# Patient Record
Sex: Female | Born: 1980 | Race: White | Hispanic: No | Smoking: Current every day smoker
Health system: Southern US, Community
[De-identification: ages and names within clinical notes are randomized; demographics above are authoritative.]

## PROBLEM LIST (undated history)

## (undated) DIAGNOSIS — N926 Irregular menstruation, unspecified: Secondary | ICD-10-CM

## (undated) DIAGNOSIS — R6882 Decreased libido: Secondary | ICD-10-CM

## (undated) DIAGNOSIS — N760 Acute vaginitis: Secondary | ICD-10-CM

## (undated) DIAGNOSIS — F419 Anxiety disorder, unspecified: Secondary | ICD-10-CM

## (undated) DIAGNOSIS — F4310 Post-traumatic stress disorder, unspecified: Secondary | ICD-10-CM

## (undated) DIAGNOSIS — B379 Candidiasis, unspecified: Secondary | ICD-10-CM

## (undated) DIAGNOSIS — M79605 Pain in left leg: Secondary | ICD-10-CM

## (undated) DIAGNOSIS — Z8619 Personal history of other infectious and parasitic diseases: Secondary | ICD-10-CM

## (undated) DIAGNOSIS — M797 Fibromyalgia: Secondary | ICD-10-CM

## (undated) DIAGNOSIS — M79662 Pain in left lower leg: Secondary | ICD-10-CM

## (undated) DIAGNOSIS — Z87898 Personal history of other specified conditions: Secondary | ICD-10-CM

## (undated) DIAGNOSIS — F17200 Nicotine dependence, unspecified, uncomplicated: Secondary | ICD-10-CM

## (undated) DIAGNOSIS — G4700 Insomnia, unspecified: Secondary | ICD-10-CM

## (undated) DIAGNOSIS — F411 Generalized anxiety disorder: Secondary | ICD-10-CM

## (undated) DIAGNOSIS — R251 Tremor, unspecified: Secondary | ICD-10-CM

## (undated) DIAGNOSIS — F329 Major depressive disorder, single episode, unspecified: Secondary | ICD-10-CM

## (undated) DIAGNOSIS — B20 Human immunodeficiency virus [HIV] disease: Principal | ICD-10-CM

## (undated) DIAGNOSIS — G709 Myoneural disorder, unspecified: Secondary | ICD-10-CM

## (undated) DIAGNOSIS — F32A Depression, unspecified: Secondary | ICD-10-CM

## (undated) HISTORY — DX: Major depressive disorder, single episode, unspecified: F32.9

## (undated) HISTORY — DX: Pain in left lower leg: M79.662

## (undated) HISTORY — DX: Nicotine dependence, unspecified, uncomplicated: F17.200

## (undated) HISTORY — DX: Depression, unspecified: F32.A

## (undated) HISTORY — DX: Candidiasis, unspecified: B37.9

## (undated) HISTORY — DX: Generalized anxiety disorder: F41.1

## (undated) HISTORY — DX: Post-traumatic stress disorder, unspecified: F43.10

## (undated) HISTORY — DX: Human immunodeficiency virus (HIV) disease: B20

## (undated) HISTORY — DX: Personal history of other specified conditions: Z87.898

## (undated) HISTORY — DX: Pain in left leg: M79.605

## (undated) HISTORY — DX: Personal history of other infectious and parasitic diseases: Z86.19

## (undated) HISTORY — DX: Fibromyalgia: M79.7

## (undated) HISTORY — DX: Acute vaginitis: N76.0

## (undated) HISTORY — DX: Insomnia, unspecified: G47.00

## (undated) HISTORY — DX: Tremor, unspecified: R25.1

## (undated) HISTORY — DX: Decreased libido: R68.82

## (undated) HISTORY — DX: Myoneural disorder, unspecified: G70.9

## (undated) HISTORY — DX: Anxiety disorder, unspecified: F41.9

## (undated) HISTORY — DX: Irregular menstruation, unspecified: N92.6

---

## 2015-03-23 ENCOUNTER — Ambulatory Visit: Payer: Self-pay

## 2015-03-23 DIAGNOSIS — B20 Human immunodeficiency virus [HIV] disease: Secondary | ICD-10-CM

## 2015-03-23 DIAGNOSIS — B1920 Unspecified viral hepatitis C without hepatic coma: Secondary | ICD-10-CM

## 2015-03-23 DIAGNOSIS — Z124 Encounter for screening for malignant neoplasm of cervix: Secondary | ICD-10-CM

## 2015-03-23 DIAGNOSIS — N644 Mastodynia: Secondary | ICD-10-CM

## 2015-03-24 LAB — CYTOLOGY - PAP

## 2015-03-25 LAB — CERVICOVAGINAL ANCILLARY ONLY: Candida vaginitis: NEGATIVE

## 2015-03-30 ENCOUNTER — Encounter: Payer: Self-pay | Admitting: *Deleted

## 2015-03-31 DIAGNOSIS — N644 Mastodynia: Secondary | ICD-10-CM | POA: Insufficient documentation

## 2015-03-31 DIAGNOSIS — B1920 Unspecified viral hepatitis C without hepatic coma: Secondary | ICD-10-CM | POA: Insufficient documentation

## 2015-03-31 NOTE — Progress Notes (Signed)
Patient is transferring from [place] and was diagnosed with HIV in 2015. She is taking Tivicay and Truvada .   She recently completed labs while in [place] and has signed a release to have them faxed to our office.  I will await results rather than repeat at this time per patient request.  She has complaint of bilateral foot pain with numbness and burning.  History of PTSD and poly substance abuse.   She does not want to take psychiatric medications due to fear of becoming addicted but states "her nerves are so bad" she uses marijuana to calm herself.  Pap Smear completed today.    Laurell Josephsammy K Henny Strauch, RN

## 2015-04-08 ENCOUNTER — Other Ambulatory Visit: Payer: Self-pay | Admitting: Licensed Clinical Social Worker

## 2015-04-08 MED ORDER — EMTRICITABINE-TENOFOVIR DF 200-300 MG PO TABS: 1.0000 | ORAL_TABLET | Freq: Every day | ORAL | Status: DC

## 2015-04-08 MED ORDER — DOLUTEGRAVIR SODIUM 50 MG PO TABS
50.0000 mg | ORAL_TABLET | Freq: Every day | ORAL | Status: DC
Start: 2015-04-08 — End: 2015-04-29

## 2015-04-13 ENCOUNTER — Other Ambulatory Visit: Payer: Self-pay

## 2015-04-13 NOTE — Telephone Encounter (Signed)
Patient has complaint of bilateral leg swelling with pain. She does not think it is fluid build up and was told she has family history of fibromyalgia.  Labs from previous provider not received yet.  Patient advised to come on tomorrow for labs so she can establish care.  Advised she can seek treatment with urgent care if needed before appointment.   Laurell Josephsammy K Daleen Steinhaus, RN

## 2015-04-13 NOTE — Progress Notes (Signed)
Lab never received.  Patient informed to come for labs in our office.   Laurell Josephsammy K King, RN

## 2015-04-14 ENCOUNTER — Other Ambulatory Visit: Payer: Self-pay

## 2015-04-14 DIAGNOSIS — Z113 Encounter for screening for infections with a predominantly sexual mode of transmission: Secondary | ICD-10-CM

## 2015-04-14 DIAGNOSIS — Z79899 Other long term (current) drug therapy: Secondary | ICD-10-CM

## 2015-04-14 DIAGNOSIS — B20 Human immunodeficiency virus [HIV] disease: Secondary | ICD-10-CM

## 2015-04-14 DIAGNOSIS — B1920 Unspecified viral hepatitis C without hepatic coma: Secondary | ICD-10-CM

## 2015-04-14 LAB — URINALYSIS
Bilirubin Urine: NEGATIVE
Glucose, UA: NEGATIVE mg/dL
Hgb urine dipstick: NEGATIVE
Ketones, ur: NEGATIVE mg/dL
Leukocytes, UA: NEGATIVE
Nitrite: NEGATIVE
Protein, ur: NEGATIVE mg/dL
Specific Gravity, Urine: 1.014 (ref 1.005–1.030)
Urobilinogen, UA: 0.2 mg/dL (ref 0.0–1.0)
pH: 5 (ref 5.0–8.0)

## 2015-04-14 NOTE — Addendum Note (Signed)
Addended by: Andree CossHOWELL, Anuhea Gassner M on: 04/14/2015 12:02 PM   Modules accepted: Orders

## 2015-04-15 LAB — CBC WITH DIFFERENTIAL/PLATELET
BASOS PCT: 2 % — AB (ref 0–1)
Basophils Absolute: 0.1 10*3/uL (ref 0.0–0.1)
EOS ABS: 0.1 10*3/uL (ref 0.0–0.7)
Eosinophils Relative: 2 % (ref 0–5)
HCT: 42 % (ref 36.0–46.0)
HEMOGLOBIN: 14.2 g/dL (ref 12.0–15.0)
LYMPHS ABS: 1.2 10*3/uL (ref 0.7–4.0)
Lymphocytes Relative: 26 % (ref 12–46)
MCH: 31.6 pg (ref 26.0–34.0)
MCHC: 33.8 g/dL (ref 30.0–36.0)
MCV: 93.5 fL (ref 78.0–100.0)
MONOS PCT: 8 % (ref 3–12)
MPV: 11.2 fL (ref 8.6–12.4)
Monocytes Absolute: 0.4 10*3/uL (ref 0.1–1.0)
NEUTROS PCT: 62 % (ref 43–77)
Neutro Abs: 3 10*3/uL (ref 1.7–7.7)
Platelets: 199 10*3/uL (ref 150–400)
RBC: 4.49 MIL/uL (ref 3.87–5.11)
RDW: 13.1 % (ref 11.5–15.5)
WBC: 4.8 10*3/uL (ref 4.0–10.5)

## 2015-04-15 LAB — COMPLETE METABOLIC PANEL WITH GFR
ALBUMIN: 4.2 g/dL (ref 3.5–5.2)
ALT: 13 U/L (ref 0–35)
AST: 16 U/L (ref 0–37)
Alkaline Phosphatase: 27 U/L — ABNORMAL LOW (ref 39–117)
BUN: 15 mg/dL (ref 6–23)
CALCIUM: 9 mg/dL (ref 8.4–10.5)
CHLORIDE: 106 meq/L (ref 96–112)
CO2: 23 meq/L (ref 19–32)
Creat: 0.9 mg/dL (ref 0.50–1.10)
GFR, EST NON AFRICAN AMERICAN: 85 mL/min
GFR, Est African American: >89 mL/min
Glucose, Bld: 79 mg/dL (ref 70–99)
Potassium: 4.8 mEq/L (ref 3.5–5.3)
Sodium: 138 mEq/L (ref 135–145)
TOTAL PROTEIN: 6.8 g/dL (ref 6.0–8.3)
Total Bilirubin: 0.5 mg/dL (ref 0.2–1.2)

## 2015-04-15 LAB — T-HELPER CELL (CD4) - (RCID CLINIC ONLY)
CD4 % Helper T Cell: 34 % (ref 33–55)
CD4 T CELL ABS: 420 /uL (ref 400–2700)

## 2015-04-15 LAB — LIPID PANEL
CHOLESTEROL: 142 mg/dL (ref 0–200)
HDL: 51 mg/dL (ref >=46)
LDL Cholesterol: 81 mg/dL (ref 0–99)
Total CHOL/HDL Ratio: 2.8 Ratio
Triglycerides: 50 mg/dL (ref <150)
VLDL: 10 mg/dL (ref 0–40)

## 2015-04-15 LAB — HEPATITIS A ANTIBODY, TOTAL: HEP A TOTAL AB: BORDERLINE — AB

## 2015-04-15 LAB — HEPATITIS C ANTIBODY: HCV Ab: REACTIVE — AB

## 2015-04-15 LAB — URINE CYTOLOGY ANCILLARY ONLY
Chlamydia: NEGATIVE
Neisseria Gonorrhea: NEGATIVE

## 2015-04-15 LAB — HEPATITIS B CORE ANTIBODY, TOTAL: HEP B C TOTAL AB: NONREACTIVE

## 2015-04-15 LAB — RPR

## 2015-04-15 LAB — HEPATITIS B SURFACE ANTIGEN: Hepatitis B Surface Ag: NEGATIVE

## 2015-04-15 LAB — HEPATITIS B SURFACE ANTIBODY,QUALITATIVE: HEP B S AB: POSITIVE — AB

## 2015-04-16 LAB — QUANTIFERON TB GOLD ASSAY (BLOOD)
INTERFERON GAMMA RELEASE ASSAY: NEGATIVE
Mitogen value: 2.99 IU/mL
QUANTIFERON NIL VALUE: 0.03 [IU]/mL
Quantiferon Tb Ag Minus Nil Value: 0.01 IU/mL
TB Ag value: 0.04 IU/mL

## 2015-04-16 LAB — HEPATITIS C RNA QUANTITATIVE: HCV Quantitative: NOT DETECTED IU/mL (ref <15)

## 2015-04-16 LAB — HIV-1 RNA ULTRAQUANT REFLEX TO GENTYP+: HIV-1 RNA Quant, Log: <1.3 {Log} (ref <1.30)

## 2015-04-21 LAB — HLA B*5701: HLA-B 5701 W/RFLX HLA-B HIGH: NEGATIVE

## 2015-04-29 ENCOUNTER — Ambulatory Visit (INDEPENDENT_AMBULATORY_CARE_PROVIDER_SITE_OTHER): Payer: Self-pay | Admitting: Infectious Disease

## 2015-04-29 ENCOUNTER — Encounter: Payer: Self-pay | Admitting: Infectious Disease

## 2015-04-29 VITALS — BP 131/99 | HR 83 | Temp 98.0°F | Wt 144.0 lb

## 2015-04-29 DIAGNOSIS — N926 Irregular menstruation, unspecified: Secondary | ICD-10-CM

## 2015-04-29 DIAGNOSIS — F4310 Post-traumatic stress disorder, unspecified: Secondary | ICD-10-CM

## 2015-04-29 DIAGNOSIS — N760 Acute vaginitis: Secondary | ICD-10-CM

## 2015-04-29 DIAGNOSIS — Z8619 Personal history of other infectious and parasitic diseases: Secondary | ICD-10-CM

## 2015-04-29 DIAGNOSIS — M797 Fibromyalgia: Secondary | ICD-10-CM | POA: Insufficient documentation

## 2015-04-29 DIAGNOSIS — Z87898 Personal history of other specified conditions: Secondary | ICD-10-CM

## 2015-04-29 DIAGNOSIS — F1991 Other psychoactive substance use, unspecified, in remission: Secondary | ICD-10-CM

## 2015-04-29 DIAGNOSIS — Z720 Tobacco use: Secondary | ICD-10-CM

## 2015-04-29 DIAGNOSIS — Z23 Encounter for immunization: Secondary | ICD-10-CM

## 2015-04-29 DIAGNOSIS — F1990 Other psychoactive substance use, unspecified, uncomplicated: Secondary | ICD-10-CM

## 2015-04-29 DIAGNOSIS — F411 Generalized anxiety disorder: Secondary | ICD-10-CM

## 2015-04-29 DIAGNOSIS — R768 Other specified abnormal immunological findings in serum: Secondary | ICD-10-CM | POA: Insufficient documentation

## 2015-04-29 DIAGNOSIS — F17200 Nicotine dependence, unspecified, uncomplicated: Secondary | ICD-10-CM

## 2015-04-29 DIAGNOSIS — N921 Excessive and frequent menstruation with irregular cycle: Secondary | ICD-10-CM | POA: Insufficient documentation

## 2015-04-29 DIAGNOSIS — B20 Human immunodeficiency virus [HIV] disease: Secondary | ICD-10-CM

## 2015-04-29 HISTORY — DX: Personal history of other specified conditions: Z87.898

## 2015-04-29 HISTORY — DX: Nicotine dependence, unspecified, uncomplicated: F17.200

## 2015-04-29 HISTORY — DX: Other psychoactive substance use, unspecified, in remission: F19.91

## 2015-04-29 HISTORY — DX: Fibromyalgia: M79.7

## 2015-04-29 HISTORY — DX: Acute vaginitis: N76.0

## 2015-04-29 HISTORY — DX: Irregular menstruation, unspecified: N92.6

## 2015-04-29 HISTORY — DX: Personal history of other infectious and parasitic diseases: Z86.19

## 2015-04-29 HISTORY — DX: Generalized anxiety disorder: F41.1

## 2015-04-29 HISTORY — DX: Human immunodeficiency virus (HIV) disease: B20

## 2015-04-29 HISTORY — DX: Post-traumatic stress disorder, unspecified: F43.10

## 2015-04-29 MED ORDER — DOLUTEGRAVIR SODIUM 50 MG PO TABS: 50.0000 mg | ORAL_TABLET | Freq: Every day | ORAL | Status: DC

## 2015-04-29 MED ORDER — TRAZODONE HCL 50 MG PO TABS: 50.0000 mg | ORAL_TABLET | Freq: Every evening | ORAL | Status: DC | PRN
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
## 2015-05-04 ENCOUNTER — Telehealth: Payer: Self-pay | Admitting: Licensed Clinical Social Worker

## 2015-05-04 NOTE — Telephone Encounter (Signed)
I had told her that WE do not prescribe klonopin in our clinic but that she would need to see a psychiatrist for that. I can give her amitriptyline if she would like but I will not be starting klonopin. She will need to get in with [facility] for that rx

## 2015-05-04 NOTE — Telephone Encounter (Signed)
Per patient trazodone is not working for her it makes her sleepy, and it does not help with her anxiousness. Patient was wondering if she could try klonopin. Please advise

## 2015-05-04 NOTE — Telephone Encounter (Signed)
Ok I will let her know 

## 2015-05-06 ENCOUNTER — Telehealth: Payer: Self-pay | Admitting: *Deleted

## 2015-05-06 ENCOUNTER — Ambulatory Visit: Payer: Self-pay

## 2015-05-06 DIAGNOSIS — F4310 Post-traumatic stress disorder, unspecified: Secondary | ICD-10-CM
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
## 2015-05-06 NOTE — Telephone Encounter (Signed)
Patient contacted [facility] Case manager Neila Gearasey Norton asking for refill of birth control, has less than 1 week left.  She is currently taking cyclafem, unable to afford it.  Per Thurmon FairMinh, Kurvelo is similar and on [facility] $9 list. Please advise.  Patient will pick it up from [facility] on [address] (added to her profile). Andree CossHowell, Darah Simkin M, RN

## 2015-05-06 NOTE — Telephone Encounter (Signed)
If Samantha Fleming is similar let us send in script for this. I am not familiar with dosing

## 2015-05-07 ENCOUNTER — Other Ambulatory Visit: Payer: Self-pay | Admitting: *Deleted

## 2015-05-07 DIAGNOSIS — Z3041 Encounter for surveillance of contraceptive pills: Secondary | ICD-10-CM

## 2015-05-07 MED ORDER — LEVONORGESTREL-ETHINYL ESTRAD 0.15-30 MG-MCG PO TABS: 1.0000 | ORAL_TABLET | Freq: Every day | ORAL | Status: DC

## 2015-05-07 NOTE — Telephone Encounter (Signed)
Sent script for 1 pill/day of Kurvelo.

## 2015-05-07 NOTE — Telephone Encounter (Signed)
Perfect

## 2015-05-20 ENCOUNTER — Ambulatory Visit: Payer: Self-pay

## 2015-06-29 ENCOUNTER — Ambulatory Visit (INDEPENDENT_AMBULATORY_CARE_PROVIDER_SITE_OTHER): Payer: Self-pay | Admitting: Infectious Disease

## 2015-06-29 ENCOUNTER — Encounter: Payer: Self-pay | Admitting: Infectious Disease

## 2015-06-29 VITALS — BP 123/86 | HR 69 | Temp 97.8°F | Ht 65.0 in | Wt 139.5 lb

## 2015-06-29 DIAGNOSIS — G909 Disorder of the autonomic nervous system, unspecified: Secondary | ICD-10-CM

## 2015-06-29 DIAGNOSIS — Z113 Encounter for screening for infections with a predominantly sexual mode of transmission: Secondary | ICD-10-CM

## 2015-06-29 DIAGNOSIS — B20 Human immunodeficiency virus [HIV] disease: Secondary | ICD-10-CM | POA: Insufficient documentation

## 2015-06-29 DIAGNOSIS — M797 Fibromyalgia: Secondary | ICD-10-CM

## 2015-06-29 DIAGNOSIS — F4310 Post-traumatic stress disorder, unspecified: Secondary | ICD-10-CM

## 2015-06-29 DIAGNOSIS — F411 Generalized anxiety disorder: Secondary | ICD-10-CM

## 2015-06-29 LAB — CBC WITH DIFFERENTIAL/PLATELET
Basophils Absolute: 0.1 10*3/uL (ref 0.0–0.1)
Basophils Relative: 2 % — ABNORMAL HIGH (ref 0–1)
EOS ABS: 0.1 10*3/uL (ref 0.0–0.7)
Eosinophils Relative: 2 % (ref 0–5)
HEMATOCRIT: 40.6 % (ref 36.0–46.0)
HEMOGLOBIN: 14 g/dL (ref 12.0–15.0)
LYMPHS ABS: 1.4 10*3/uL (ref 0.7–4.0)
Lymphocytes Relative: 34 % (ref 12–46)
MCH: 32.6 pg (ref 26.0–34.0)
MCHC: 34.5 g/dL (ref 30.0–36.0)
MCV: 94.4 fL (ref 78.0–100.0)
MONOS PCT: 12 % (ref 3–12)
MPV: 11.2 fL (ref 8.6–12.4)
Monocytes Absolute: 0.5 10*3/uL (ref 0.1–1.0)
NEUTROS PCT: 50 % (ref 43–77)
Neutro Abs: 2 10*3/uL (ref 1.7–7.7)
Platelets: 182 10*3/uL (ref 150–400)
RBC: 4.3 MIL/uL (ref 3.87–5.11)
RDW: 13.3 % (ref 11.5–15.5)
WBC: 4 10*3/uL (ref 4.0–10.5)

## 2015-06-29 LAB — COMPLETE METABOLIC PANEL WITH GFR
ALBUMIN: 4.6 g/dL (ref 3.5–5.2)
ALK PHOS: 31 U/L — AB (ref 39–117)
ALT: 11 U/L (ref 0–35)
AST: 14 U/L (ref 0–37)
BUN: 15 mg/dL (ref 6–23)
CO2: 26 mEq/L (ref 19–32)
Calcium: 9.5 mg/dL (ref 8.4–10.5)
Chloride: 104 mEq/L (ref 96–112)
Creat: 0.81 mg/dL (ref 0.50–1.10)
GFR, Est African American: >89 mL/min
GFR, Est Non African American: >89 mL/min
Glucose, Bld: 120 mg/dL — ABNORMAL HIGH (ref 70–99)
POTASSIUM: 3.8 meq/L (ref 3.5–5.3)
Sodium: 136 mEq/L (ref 135–145)
Total Bilirubin: 0.6 mg/dL (ref 0.2–1.2)
Total Protein: 7 g/dL (ref 6.0–8.3)

## 2015-06-29 LAB — TSH: TSH: 2.217 u[IU]/mL (ref 0.350–4.500)

## 2015-06-29 LAB — HEMOGLOBIN A1C
HEMOGLOBIN A1C: 5.1 % (ref <5.7)
MEAN PLASMA GLUCOSE: 100 mg/dL (ref <117)

## 2015-06-29 MED ORDER — GABAPENTIN 100 MG PO CAPS: 300.0000 mg | ORAL_CAPSULE | Freq: Every day | ORAL | Status: DC

## 2015-06-29 MED ORDER — DESCOVY 200-25 MG PO TABS: 1.0000 | ORAL_TABLET | Freq: Every day | ORAL | Status: DC
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
## 2015-06-30 ENCOUNTER — Other Ambulatory Visit: Payer: Self-pay | Admitting: *Deleted

## 2015-06-30 LAB — RPR

## 2015-06-30 MED ORDER — GABAPENTIN 100 MG PO CAPS: 300.0000 mg | ORAL_CAPSULE | Freq: Every day | ORAL | Status: DC

## 2015-06-30 NOTE — Telephone Encounter (Signed)
Rx for Gabapentin as it was sent to the wrong pharmacy, [facility] as per the patient.

## 2015-07-01 LAB — T-HELPER CELL (CD4) - (RCID CLINIC ONLY)
CD4 % Helper T Cell: 41 % (ref 33–55)
CD4 T Cell Abs: 580 /uL (ref 400–2700)

## 2015-07-01 LAB — HIV-1 RNA QUANT-NO REFLEX-BLD
HIV 1 RNA Quant: <20 copies/mL (ref <20)
HIV-1 RNA Quant, Log: <1.3 {Log} (ref <1.30)

## 2015-08-09 ENCOUNTER — Telehealth: Payer: Self-pay | Admitting: *Deleted

## 2015-08-09 NOTE — Telephone Encounter (Signed)
[facility] does not have any appointments this week.  Advised pt to drink plenty of fluids to stay hydrated.  Pt stated that she has already tried several OTC medications.  The pt stated that she has already called a couple of "minute clinics" and will probably go to one of them.

## 2015-09-15 ENCOUNTER — Encounter: Payer: Self-pay | Admitting: Infectious Disease

## 2015-09-15 ENCOUNTER — Ambulatory Visit (INDEPENDENT_AMBULATORY_CARE_PROVIDER_SITE_OTHER): Payer: Self-pay | Admitting: Infectious Disease

## 2015-09-15 VITALS — BP 117/83 | HR 72 | Temp 97.8°F | Ht 65.0 in | Wt 141.0 lb

## 2015-09-15 DIAGNOSIS — F4310 Post-traumatic stress disorder, unspecified: Secondary | ICD-10-CM

## 2015-09-15 DIAGNOSIS — F1990 Other psychoactive substance use, unspecified, uncomplicated: Secondary | ICD-10-CM

## 2015-09-15 DIAGNOSIS — Z8619 Personal history of other infectious and parasitic diseases: Secondary | ICD-10-CM

## 2015-09-15 DIAGNOSIS — Z720 Tobacco use: Secondary | ICD-10-CM

## 2015-09-15 DIAGNOSIS — G4700 Insomnia, unspecified: Secondary | ICD-10-CM

## 2015-09-15 DIAGNOSIS — Z87898 Personal history of other specified conditions: Secondary | ICD-10-CM

## 2015-09-15 DIAGNOSIS — F411 Generalized anxiety disorder: Secondary | ICD-10-CM

## 2015-09-15 DIAGNOSIS — F17200 Nicotine dependence, unspecified, uncomplicated: Secondary | ICD-10-CM

## 2015-09-15 DIAGNOSIS — Z23 Encounter for immunization: Secondary | ICD-10-CM

## 2015-09-15 DIAGNOSIS — R6882 Decreased libido: Secondary | ICD-10-CM

## 2015-09-15 DIAGNOSIS — G909 Disorder of the autonomic nervous system, unspecified: Secondary | ICD-10-CM

## 2015-09-15 DIAGNOSIS — M79605 Pain in left leg: Secondary | ICD-10-CM

## 2015-09-15 DIAGNOSIS — N926 Irregular menstruation, unspecified: Secondary | ICD-10-CM

## 2015-09-15 DIAGNOSIS — N760 Acute vaginitis: Secondary | ICD-10-CM

## 2015-09-15 DIAGNOSIS — B379 Candidiasis, unspecified: Secondary | ICD-10-CM

## 2015-09-15 DIAGNOSIS — B20 Human immunodeficiency virus [HIV] disease: Secondary | ICD-10-CM

## 2015-09-15 DIAGNOSIS — F1991 Other psychoactive substance use, unspecified, in remission: Secondary | ICD-10-CM

## 2015-09-15 DIAGNOSIS — M797 Fibromyalgia: Secondary | ICD-10-CM

## 2015-09-15 HISTORY — DX: Insomnia, unspecified: G47.00

## 2015-09-15 HISTORY — DX: Candidiasis, unspecified: B37.9

## 2015-09-15 HISTORY — DX: Decreased libido: R68.82

## 2015-09-15 HISTORY — DX: Pain in left leg: M79.605

## 2015-09-15 LAB — CBC WITH DIFFERENTIAL/PLATELET
BASOS ABS: 0.1 10*3/uL (ref 0.0–0.1)
Basophils Relative: 2 % — ABNORMAL HIGH (ref 0–1)
EOS ABS: 0.1 10*3/uL (ref 0.0–0.7)
EOS PCT: 2 % (ref 0–5)
HCT: 40.4 % (ref 36.0–46.0)
Hemoglobin: 13.6 g/dL (ref 12.0–15.0)
LYMPHS ABS: 1.3 10*3/uL (ref 0.7–4.0)
Lymphocytes Relative: 33 % (ref 12–46)
MCH: 32.3 pg (ref 26.0–34.0)
MCHC: 33.7 g/dL (ref 30.0–36.0)
MCV: 96 fL (ref 78.0–100.0)
MONO ABS: 0.4 10*3/uL (ref 0.1–1.0)
MONOS PCT: 11 % (ref 3–12)
MPV: 10.6 fL (ref 8.6–12.4)
Neutro Abs: 2 10*3/uL (ref 1.7–7.7)
Neutrophils Relative %: 52 % (ref 43–77)
PLATELETS: 197 10*3/uL (ref 150–400)
RBC: 4.21 MIL/uL (ref 3.87–5.11)
RDW: 12.9 % (ref 11.5–15.5)
WBC: 3.9 10*3/uL — ABNORMAL LOW (ref 4.0–10.5)

## 2015-09-15 MED ORDER — FLUCONAZOLE 150 MG PO TABS: 150.0000 mg | ORAL_TABLET | Freq: Once | ORAL | Status: DC

## 2015-09-15 NOTE — Progress Notes (Signed)
Chief complaints:   New Left leg pain   Worsening Persistent bleeding despite her contraceptive which she dislikes  Worsening Profound fatigue  New Yeast infection  Subjective:    Patient ID: Samantha Fleming, female    DOB: [birth year], 34 y.o.   MRN: [id number]  HPI   [name] is a 34 year old Caucasian lady with history of HIV infection who had been incarcerated for assault and [place]. She tested positive for HIV while in prison also tested positive for hepatitis C. She had been managed after coming out of prison at the [facility] in [place]. She was taking care of by Hassel Neth.  She was initially started on COMPLERA but did not tolerate it due to problems of chronic nausea diarrhea and vomiting. Ultimately she was changed over to [facility] and Truvada with nice virological suppression and immune reconstitution.  Consideration was being given to treatment for hepatitis C but her hepatitis C RNA here is undetectable and she is clearly clear of this infection.  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 She also suffers from post traumatic stress disorder in part related to her intravenous drug use.  He has chronic generalized anxiety as well as problems with insomnia. She's been intolerant of selective serotonin reuptake inhibitors.  She also has history of diffuse myalgias and believes that she may have fibromyalgia as her mother has.  She has some chronic numbness and tingling in her extremities left worse than right along with intermittent swelling of her lower extremities bilaterally.   She comes to clinic today with the above mentoined complaints.   She is largely concerned with her profound fatigue and her reduced libido which she attributes to her oral contraceptive.  She states the latter is not working either as she continues to  bleed. Her menstrual bleeding was also preceded by ssx of UTI.  She has been continuing to smoke cigarettes and now is c/o left leg swelling over past few days.   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      Lab Results  Component Value Date   HIV1RNAQUANT <20 06/29/2015   Lab Results  Component Value Date   CD4TABS 580 06/29/2015   CD4TABS 420 04/14/2015    Past Medical History  Diagnosis Date  . History of hepatitis C 04/29/2015  . HIV disease 04/29/2015  . PTSD (post-traumatic stress disorder) 04/29/2015  . Vaginitis and vulvovaginitis 04/29/2015  . Irregular menses 04/29/2015  . GAD (generalized anxiety disorder) 04/29/2015  . 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 History of intravenous drug use in remission   . Anxiety   . Depression   . Neuromuscular disorder   . Smoker 04/29/2015  . Fibromyalgia 04/29/2015  . Left leg pain 09/15/2015   No past surgical history on file.  Family History  Problem Relation Age of Onset  . Fibromyalgia Mother   . Sudden death Maternal Grandfather    Social History  Substance Use Topics  . Smoking status: Current Every Day Smoker -- 1.00 packs/day for 16 years    Types: Cigarettes  . Smokeless tobacco: None  . Alcohol Use: 0.0 oz/week    0 Standard drinks or equivalent per week   Allergies  Allergen Reactions  . Asa [Aspirin] Swelling  . Rilpivirine Diarrhea    Patient had severe gastrointestinal complaints including diarrhea while on  COMPLERA. She DID NOT HAVE ANAPHYLAXIS AND SHE HAS TOLERATED THE OTHER 2 COMPONENTS OF COMPLERA TNF AND EMTRICTABINE WITHOUT ANY PROBLEMS    Current outpatient prescriptions:  .  DESCOVY 200-25 MG per tablet, Take 1 tablet by mouth daily., Disp: 30 tablet, Rfl: 11 .  dolutegravir (TIVICAY) 50 MG tablet, Take 1 tablet (50 mg total) by mouth daily., Disp: 30 tablet, Rfl: 11 .  levonorgestrel-ethinyl estradiol (NORDETTE) 0.15-30 MG-MCG tablet, Take 1 tablet  by mouth daily., Disp: 1 Package, Rfl: 11 .  traZODone (DESYREL) 50 MG tablet, Take 1 tablet (50 mg total) by mouth at bedtime as needed for sleep., Disp: 30 tablet, Rfl: 11 .  fluconazole (DIFLUCAN) 150 MG tablet, Take 1 tablet (150 mg total) by mouth once., Disp: 1 tablet, Rfl: 1 .  gabapentin (NEURONTIN) 100 MG capsule, Take 3 capsules (300 mg total) by mouth at bedtime. (Patient not taking: Reported on 09/15/2015), Disp: 90 capsule, Rfl: 4    Review of Systems  Constitutional: Positive for activity change and fatigue. Negative for fever, chills, diaphoresis, appetite change and unexpected weight change.  HENT: Positive for postnasal drip and rhinorrhea. Negative for congestion, sinus pressure, sneezing, sore throat and trouble swallowing.   Eyes: Negative for photophobia and visual disturbance.  Respiratory: Negative for cough, chest tightness, shortness of breath, wheezing and stridor.   Cardiovascular: Positive for leg swelling. Negative for chest pain and palpitations.  Gastrointestinal: Negative for nausea, vomiting, abdominal pain, diarrhea, constipation, blood in stool, abdominal distention and anal bleeding.  Genitourinary: Positive for vaginal bleeding and menstrual problem. Negative for dysuria, hematuria, flank pain and difficulty urinating.  Musculoskeletal: Positive for myalgias, joint swelling and arthralgias. Negative for back pain and gait problem.  Skin: Negative for color change, pallor, rash and wound.  Neurological: Positive for numbness. Negative for dizziness, tremors, syncope and weakness.  Hematological: Negative for adenopathy. Does not bruise/bleed easily.  Psychiatric/Behavioral: Positive for sleep disturbance and dysphoric mood. Negative for suicidal ideas, behavioral problems, confusion, self-injury and decreased concentration. The patient is nervous/anxious and is hyperactive.        Objective:   Physical Exam  Constitutional: She is oriented to person, place,  and time. She appears well-developed and well-nourished. No distress.  HENT:  Head: Normocephalic and atraumatic.  Mouth/Throat: No oropharyngeal exudate.  Eyes: Conjunctivae and EOM are normal. No scleral icterus.  Neck: Normal range of motion. Neck supple.  Cardiovascular: Normal rate, regular rhythm, normal heart sounds and intact distal pulses.  Exam reveals no gallop and no friction rub.   No murmur heard. Pulmonary/Chest: Effort normal and breath sounds normal. No respiratory distress. She has no wheezes. She has no rales.  Abdominal: Soft. Bowel sounds are normal. She exhibits no distension. There is no tenderness.  Musculoskeletal: She exhibits no edema.       Left lower leg: She exhibits tenderness.  Neurological: She is alert and oriented to person, place, and time. She exhibits normal muscle tone. Coordination normal.  Skin: Skin is warm and dry. No rash noted. She is not diaphoretic. No erythema. No pallor.  Psychiatric: Judgment and thought content normal. Her mood appears anxious. Her affect is not inappropriate. Cognition and memory are not impaired. She does not express impulsivity or inappropriate judgment.  Nursing note and vitals reviewed.       Assessment & Plan:   Left leg pain and swelling: is out of proportion to exam but will get doppler given her hx of smoking and contraceptives that was have rx  for menorrhagia, will check dopplers to rule out DVT. She is NOT tachycardic or hypoxic making me less concerned for PE esp given that she has had prior problems with LE edema  Heavy menstrual bleeding but on contraceptives that she dislikes: refer to Gyn and [facility] is a very helpful resource for the patient for both and would consider IUD  Reduced libido: she thinks is due to her contraceptive so will see how she does off of this. Depression may be playing a role. Not on a typical SSRI that would cause problems with libido  HIV disease: Continue TIVICAY and  DESCOVY, Labs today and in 4 months time   PTSD, Generalized anxiety, Insomnia: continue trazodone for now and would be helped by seeing a counselor regularly  History of IV drug use in remission: she is not in a substance abuse treatment program again will need vigilance for relapse and to have her in therapy.  History of hepatitis C antibody positivity her hepatitis C RNA is active meaning if she did have hepatitis C she has clearly cleared the virus.  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

## 2015-09-16 ENCOUNTER — Ambulatory Visit (HOSPITAL_COMMUNITY)
Admission: RE | Admit: 2015-09-16 | Discharge: 2015-09-16 | Disposition: A | Discharge status: Home / Self Care | Payer: Self-pay | Source: Ambulatory Visit | Attending: Infectious Disease | Admitting: Infectious Disease

## 2015-09-16 DIAGNOSIS — M79662 Pain in left lower leg: Secondary | ICD-10-CM | POA: Insufficient documentation

## 2015-09-16 DIAGNOSIS — M79605 Pain in left leg: Secondary | ICD-10-CM

## 2015-09-16 DIAGNOSIS — M7989 Other specified soft tissue disorders: Secondary | ICD-10-CM | POA: Insufficient documentation

## 2015-09-16 LAB — COMPLETE METABOLIC PANEL WITH GFR
ALT: 12 U/L (ref 6–29)
AST: 16 U/L (ref 10–30)
Albumin: 4.2 g/dL (ref 3.6–5.1)
Alkaline Phosphatase: 25 U/L — ABNORMAL LOW (ref 33–115)
BILIRUBIN TOTAL: 0.4 mg/dL (ref 0.2–1.2)
BUN: 15 mg/dL (ref 7–25)
CO2: 25 mmol/L (ref 20–31)
CREATININE: 0.81 mg/dL (ref 0.50–1.10)
Calcium: 9.1 mg/dL (ref 8.6–10.2)
Chloride: 107 mmol/L (ref 98–110)
GFR, Est Non African American: >89 mL/min (ref >=60)
Glucose, Bld: 83 mg/dL (ref 65–99)
Potassium: 4.9 mmol/L (ref 3.5–5.3)
Sodium: 141 mmol/L (ref 135–146)
TOTAL PROTEIN: 6.4 g/dL (ref 6.1–8.1)

## 2015-09-16 LAB — T-HELPER CELL (CD4) - (RCID CLINIC ONLY)
CD4 T CELL ABS: 530 /uL (ref 400–2700)
CD4 T CELL HELPER: 39 % (ref 33–55)

## 2015-09-16 LAB — URINE CYTOLOGY ANCILLARY ONLY
Chlamydia: NEGATIVE
NEISSERIA GONORRHEA: NEGATIVE

## 2015-09-16 LAB — RPR

## 2015-09-16 NOTE — Progress Notes (Signed)
Preliminary results by tech - Left Lower Ext. Venous Completed. Negative for deep and superficial vein thrombosis in the left lower extremity. I incidental findings - in the areas of concern in the left calf,  There is a non vascularized  hypoechoic structure measuring 3.6x 1.6x 0.9 cm in the proximal, medical calf area that was noted. Results given to Dr. Deliah Boston Sturdivant, BS, RDMS, RVT

## 2015-09-17 LAB — HIV RNA, RTPCR W/R GT (RTI, PI,INT): HIV 1 RNA Quant: <20 copies/mL (ref <20)

## 2015-09-22 ENCOUNTER — Other Ambulatory Visit: Payer: Self-pay

## 2015-09-27 ENCOUNTER — Other Ambulatory Visit: Payer: Self-pay | Admitting: *Deleted

## 2015-09-27 DIAGNOSIS — B379 Candidiasis, unspecified: Secondary | ICD-10-CM

## 2015-09-27 MED ORDER — FLUCONAZOLE 150 MG PO TABS
150.0000 mg | ORAL_TABLET | Freq: Once | ORAL | Status: DC
Start: 2015-09-27 — End: 2015-10-06

## 2015-10-06 ENCOUNTER — Encounter: Payer: Self-pay | Admitting: Infectious Disease

## 2015-10-06 ENCOUNTER — Ambulatory Visit (INDEPENDENT_AMBULATORY_CARE_PROVIDER_SITE_OTHER): Payer: Self-pay | Admitting: Infectious Disease

## 2015-10-06 VITALS — BP 119/79 | HR 96 | Temp 98.4°F | Wt 142.0 lb

## 2015-10-06 DIAGNOSIS — F4310 Post-traumatic stress disorder, unspecified: Secondary | ICD-10-CM

## 2015-10-06 DIAGNOSIS — Z87898 Personal history of other specified conditions: Secondary | ICD-10-CM

## 2015-10-06 DIAGNOSIS — G909 Disorder of the autonomic nervous system, unspecified: Secondary | ICD-10-CM

## 2015-10-06 DIAGNOSIS — B20 Human immunodeficiency virus [HIV] disease: Secondary | ICD-10-CM

## 2015-10-06 DIAGNOSIS — F411 Generalized anxiety disorder: Secondary | ICD-10-CM

## 2015-10-06 DIAGNOSIS — M79662 Pain in left lower leg: Secondary | ICD-10-CM | POA: Insufficient documentation

## 2015-10-06 DIAGNOSIS — F1990 Other psychoactive substance use, unspecified, uncomplicated: Secondary | ICD-10-CM

## 2015-10-06 DIAGNOSIS — R251 Tremor, unspecified: Secondary | ICD-10-CM

## 2015-10-06 DIAGNOSIS — B379 Candidiasis, unspecified: Secondary | ICD-10-CM

## 2015-10-06 DIAGNOSIS — F1991 Other psychoactive substance use, unspecified, in remission: Secondary | ICD-10-CM

## 2015-10-06 HISTORY — DX: Pain in left lower leg: M79.662

## 2015-10-06 HISTORY — DX: Tremor, unspecified: R25.1

## 2015-10-06 NOTE — Progress Notes (Signed)
Chief complaints:   Persistent Left leg pain   Tremor that has been present for several months  Subjective:    Patient ID: Samantha Fleming, female    DOB: Mar 09, 1981, 34 y.o.   MRN: [id number]  Leg Pain  Associated symptoms include numbness.    [name] is a 34 year old Caucasian lady with history of HIV infection who had been incarcerated for assault and [place]. She tested positive for HIV while in prison also tested positive for hepatitis C. She had been managed after coming out of prison at the [facility] in [place]. She was taking care of by Hassel Neth.  She was initially started on COMPLERA but did not tolerate it due to problems of chronic nausea diarrhea and vomiting. Ultimately she was changed over to [facility] and Truvada with nice virological suppression and immune reconstitution.  Consideration was being given to treatment for hepatitis C but her hepatitis C RNA here is undetectable and she is clearly clear of this infection.  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 She also suffers from post traumatic stress disorder in part related to her intravenous drug use.  He has chronic generalized anxiety as well as problems with insomnia. She's been intolerant of selective serotonin reuptake inhibitors.  She also has history of diffuse myalgias and believes that she may have fibromyalgia as her mother has.  She has some chronic numbness and tingling in her extremities left worse than right along with intermittent swelling of her lower extremities bilaterally.   She came into clinic 1-2 weeks ago  with concern about profound fatigue and menstrual bleeding and left lower extremity pain.  Performed a Doppler which excluded a deep venous thrombosis but showed a hypoechoic area in the calf.  She has an area on exam is still tender in this  region. Unfortunate she does not have insurance nor did she have an orange card so she is now being sent the bill by the vascular department. I told her we need to make sure she has not scar before we pursue any further imaging or referrals.   She also mentions problems with a tremor and aspirated look for diabetes and thyroid function which we are to check formally this summer. Tremor seems relatively stable but family member had mentioned that she should have it worked up further.   Lab Results  Component Value Date   HIV1RNAQUANT <20 09/15/2015   Lab Results  Component Value Date   CD4TABS 530 09/15/2015   CD4TABS 580 06/29/2015   CD4TABS 420 04/14/2015    Past Medical History  Diagnosis Date  . History of hepatitis C 04/29/2015  . HIV disease (HCC) 04/29/2015  . PTSD (post-traumatic stress disorder) 04/29/2015  . Vaginitis and vulvovaginitis 04/29/2015  . Irregular menses 04/29/2015  . GAD (generalized anxiety disorder) 04/29/2015  . 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 History of intravenous drug use in remission   . Anxiety   . Depression   . Neuromuscular disorder (HCC)   . Smoker 04/29/2015  . Fibromyalgia 04/29/2015  . Left leg pain 09/15/2015  . Yeast infection 09/15/2015  . Reduced libido 09/15/2015  . Insomnia 09/15/2015   No past surgical history on file.  Family History  Problem Relation Age of Onset  . Fibromyalgia Mother   . Sudden death Maternal Grandfather    Social History  Substance Use Topics  .  Smoking status: Current Every Day Smoker -- 1.00 packs/day for 16 years    Types: Cigarettes  . Smokeless tobacco: None  . Alcohol Use: 0.0 oz/week    0 Standard drinks or equivalent per week   Allergies  Allergen Reactions  . Asa [Aspirin] Swelling  . Rilpivirine Diarrhea    Patient had severe gastrointestinal complaints including diarrhea while on COMPLERA. She DID NOT HAVE ANAPHYLAXIS AND SHE HAS TOLERATED THE OTHER 2 COMPONENTS OF COMPLERA TNF  AND EMTRICTABINE WITHOUT ANY PROBLEMS    Current outpatient prescriptions:  .  DESCOVY 200-25 MG per tablet, Take 1 tablet by mouth daily., Disp: 30 tablet, Rfl: 11 .  dolutegravir (TIVICAY) 50 MG tablet, Take 1 tablet (50 mg total) by mouth daily., Disp: 30 tablet, Rfl: 11 .  traZODone (DESYREL) 50 MG tablet, Take 1 tablet (50 mg total) by mouth at bedtime as needed for sleep., Disp: 30 tablet, Rfl: 11 .  fluconazole (DIFLUCAN) 150 MG tablet, Take 1 tablet (150 mg total) by mouth once. (Patient not taking: Reported on 10/06/2015), Disp: 1 tablet, Rfl: 3 .  gabapentin (NEURONTIN) 100 MG capsule, Take 3 capsules (300 mg total) by mouth at bedtime. (Patient not taking: Reported on 09/15/2015), Disp: 90 capsule, Rfl: 4 .  levonorgestrel-ethinyl estradiol (NORDETTE) 0.15-30 MG-MCG tablet, Take 1 tablet by mouth daily. (Patient not taking: Reported on 10/06/2015), Disp: 1 Package, Rfl: 11    Review of Systems  Constitutional: Positive for activity change and fatigue. Negative for fever, chills, diaphoresis, appetite change and unexpected weight change.  HENT: Positive for postnasal drip and rhinorrhea. Negative for congestion, sinus pressure, sneezing, sore throat and trouble swallowing.   Eyes: Negative for photophobia and visual disturbance.  Respiratory: Negative for cough, chest tightness, shortness of breath, wheezing and stridor.   Cardiovascular: Positive for leg swelling. Negative for chest pain and palpitations.  Gastrointestinal: Negative for nausea, vomiting, abdominal pain, diarrhea, constipation, blood in stool, abdominal distention and anal bleeding.  Genitourinary: Negative for dysuria, hematuria, flank pain and difficulty urinating.  Musculoskeletal: Positive for myalgias, joint swelling and arthralgias. Negative for back pain and gait problem.  Skin: Negative for color change, pallor, rash and wound.  Neurological: Positive for tremors and numbness. Negative for dizziness, syncope  and weakness.  Hematological: Negative for adenopathy. Does not bruise/bleed easily.  Psychiatric/Behavioral: Positive for sleep disturbance and dysphoric mood. Negative for suicidal ideas, behavioral problems, confusion, self-injury and decreased concentration. The patient is nervous/anxious and is hyperactive.        Objective:   Physical Exam  Constitutional: She is oriented to person, place, and time. She appears well-developed and well-nourished. No distress.  HENT:  Head: Normocephalic and atraumatic.  Mouth/Throat: No oropharyngeal exudate.  Eyes: Conjunctivae and EOM are normal. No scleral icterus.  Neck: Normal range of motion. Neck supple.  Cardiovascular: Normal rate, regular rhythm, normal heart sounds and intact distal pulses.  Exam reveals no gallop and no friction rub.   No murmur heard. Pulmonary/Chest: Effort normal and breath sounds normal. No respiratory distress. She has no wheezes. She has no rales.  Abdominal: Soft. Bowel sounds are normal. She exhibits no distension. There is no tenderness.  Musculoskeletal: She exhibits no edema.       Left lower leg: She exhibits tenderness.  Neurological: She is alert and oriented to person, place, and time. She exhibits normal muscle tone. Coordination normal.  Skin: Skin is warm and dry. No rash noted. She is not diaphoretic. No erythema. No pallor.  Psychiatric:  Judgment and thought content normal. Her mood appears anxious. Her affect is not inappropriate. Cognition and memory are not impaired. She does not express impulsivity or inappropriate judgment.  Nursing note and vitals reviewed.       Assessment & Plan:   Left leg pain and swelling: Doppler did not show a DVT but it did show a hypoechoic area several centimeters in dimensions. Would consider referral to orthopedic surgery and/or get an MRI but since she has no aren't card will hold off for now. She has no fever or other worrisome symptoms at present.   Heavy  menstrual bleeding but on contraceptives that she dislikes: refer to Gyn and [facility] is a very helpful resource for the patient for both and would consider IUD  HIV disease: Continue TIVICAY and DESCOVY perfect virological suppression  PTSD, Generalized anxiety, Insomnia: continue trazodone for now and would be helped by seeing a counselor regularly  History of IV drug use in remission: she is not in a substance abuse treatment program again will need vigilance for relapse and to have her in therapy.  Fibromyalgia: continue trazodone but stopped gabapentin since it is causing her to sleep too much try to increase exercise  Smoking counsel to stop Fatigue: partly due to poor sleep hygeine, potentially due to anemia, and depression, hopefully not due to substance abuse relapse  Yeast infection: give course of fluconazole  Tremor: Not sure what is causing this would be helpful she can see a neurologist when she has insurance  I spent greater than 25 minutes with the patient including greater than 50% of time in face to face counsel of the patient re her HIV, LE edema, smoking, menorrhagia, depression, insomnia, fatigue, tremor and in coordination of their care.

## 2015-11-03 NOTE — Progress Notes (Signed)
[facility] notified via fax. Hadassa Cermak M, RN   

## 2016-01-12 ENCOUNTER — Other Ambulatory Visit: Payer: Self-pay

## 2016-01-14 ENCOUNTER — Telehealth: Payer: Self-pay | Admitting: Infectious Disease

## 2016-01-14 NOTE — Telephone Encounter (Signed)
Patient angry at multiple reschedules  She was rescheduled from when I was going to be at [facility] to Wednesday of Dr. Little Ishikawa and then rescheduled again.  She will need a new provider  She will renew ADAP with Nelson Chimes on Monday

## 2016-01-17 ENCOUNTER — Other Ambulatory Visit (INDEPENDENT_AMBULATORY_CARE_PROVIDER_SITE_OTHER): Payer: Self-pay

## 2016-01-17 DIAGNOSIS — B20 Human immunodeficiency virus [HIV] disease: Secondary | ICD-10-CM

## 2016-01-17 LAB — CBC WITH DIFFERENTIAL/PLATELET
BASOS PCT: 2 % — AB (ref 0–1)
Basophils Absolute: 0.1 10*3/uL (ref 0.0–0.1)
Eosinophils Absolute: 0.1 10*3/uL (ref 0.0–0.7)
Eosinophils Relative: 2 % (ref 0–5)
HEMATOCRIT: 41.2 % (ref 36.0–46.0)
HEMOGLOBIN: 13.7 g/dL (ref 12.0–15.0)
LYMPHS PCT: 34 % (ref 12–46)
Lymphs Abs: 1.6 10*3/uL (ref 0.7–4.0)
MCH: 31.7 pg (ref 26.0–34.0)
MCHC: 33.3 g/dL (ref 30.0–36.0)
MCV: 95.4 fL (ref 78.0–100.0)
MONO ABS: 0.5 10*3/uL (ref 0.1–1.0)
MONOS PCT: 11 % (ref 3–12)
MPV: 10.9 fL (ref 8.6–12.4)
NEUTROS ABS: 2.3 10*3/uL (ref 1.7–7.7)
NEUTROS PCT: 51 % (ref 43–77)
Platelets: 210 10*3/uL (ref 150–400)
RBC: 4.32 MIL/uL (ref 3.87–5.11)
RDW: 13 % (ref 11.5–15.5)
WBC: 4.6 10*3/uL (ref 4.0–10.5)

## 2016-01-17 LAB — COMPLETE METABOLIC PANEL WITH GFR
ALBUMIN: 4.6 g/dL (ref 3.6–5.1)
ALK PHOS: 31 U/L — AB (ref 33–115)
ALT: 9 U/L (ref 6–29)
AST: 12 U/L (ref 10–30)
BILIRUBIN TOTAL: 0.4 mg/dL (ref 0.2–1.2)
BUN: 14 mg/dL (ref 7–25)
CALCIUM: 9.5 mg/dL (ref 8.6–10.2)
CO2: 23 mmol/L (ref 20–31)
CREATININE: 0.87 mg/dL (ref 0.50–1.10)
Chloride: 103 mmol/L (ref 98–110)
GFR, Est African American: >89 mL/min (ref >=60)
GFR, Est Non African American: 88 mL/min (ref >=60)
GLUCOSE: 100 mg/dL — AB (ref 65–99)
POTASSIUM: 4 mmol/L (ref 3.5–5.3)
SODIUM: 138 mmol/L (ref 135–146)
TOTAL PROTEIN: 6.6 g/dL (ref 6.1–8.1)

## 2016-01-18 LAB — HIV-1 RNA QUANT-NO REFLEX-BLD
HIV 1 RNA Quant: <20 copies/mL (ref <20)
HIV-1 RNA Quant, Log: <1.3 Log copies/mL (ref <1.30)

## 2016-01-18 LAB — RPR

## 2016-01-19 ENCOUNTER — Other Ambulatory Visit: Payer: Self-pay

## 2016-01-19 LAB — T-HELPER CELL (CD4) - (RCID CLINIC ONLY)
CD4 T CELL ABS: 640 /uL (ref 400–2700)
CD4 T CELL HELPER: 38 % (ref 33–55)

## 2016-02-02 ENCOUNTER — Ambulatory Visit: Payer: Self-pay | Admitting: Infectious Disease

## 2016-02-02 ENCOUNTER — Ambulatory Visit (INDEPENDENT_AMBULATORY_CARE_PROVIDER_SITE_OTHER): Payer: Self-pay | Admitting: Infectious Diseases

## 2016-02-02 ENCOUNTER — Encounter: Payer: Self-pay | Admitting: Infectious Diseases

## 2016-02-02 VITALS — BP 108/74 | HR 79 | Temp 98.3°F | Wt 127.0 lb

## 2016-02-02 DIAGNOSIS — Z79899 Other long term (current) drug therapy: Secondary | ICD-10-CM

## 2016-02-02 DIAGNOSIS — J0121 Acute recurrent ethmoidal sinusitis: Secondary | ICD-10-CM

## 2016-02-02 DIAGNOSIS — N926 Irregular menstruation, unspecified: Secondary | ICD-10-CM

## 2016-02-02 DIAGNOSIS — Z113 Encounter for screening for infections with a predominantly sexual mode of transmission: Secondary | ICD-10-CM

## 2016-02-02 DIAGNOSIS — F17200 Nicotine dependence, unspecified, uncomplicated: Secondary | ICD-10-CM

## 2016-02-02 DIAGNOSIS — Z720 Tobacco use: Secondary | ICD-10-CM

## 2016-02-02 DIAGNOSIS — B20 Human immunodeficiency virus [HIV] disease: Secondary | ICD-10-CM

## 2016-02-02 DIAGNOSIS — R894 Abnormal immunological findings in specimens from other organs, systems and tissues: Secondary | ICD-10-CM

## 2016-02-02 DIAGNOSIS — G909 Disorder of the autonomic nervous system, unspecified: Secondary | ICD-10-CM

## 2016-02-02 DIAGNOSIS — R768 Other specified abnormal immunological findings in serum: Secondary | ICD-10-CM

## 2016-02-02 MED ORDER — BUPROPION HCL ER (SR) 150 MG PO TB12: 150.0000 mg | ORAL_TABLET | Freq: Two times a day (BID) | ORAL | Status: DC

## 2016-02-02 MED ORDER — AZITHROMYCIN 500 MG PO TABS: 500.0000 mg | ORAL_TABLET | Freq: Every day | ORAL | Status: DC

## 2016-02-02 NOTE — Assessment & Plan Note (Signed)
Will have her seen by GYN for help with OCPs She does not want IUD. Gained too much wt on depo shots.

## 2016-02-02 NOTE — Assessment & Plan Note (Signed)
Offered to change her to FDC which she does not want to do.  She is doing very well.  Offered condoms Will have her see GYN Needs lipids, gc/chlamydia, rpr at f/u  Will see her back in 6 months

## 2016-02-02 NOTE — Assessment & Plan Note (Signed)
VL (-) Recheck if change in risk

## 2016-02-02 NOTE — Assessment & Plan Note (Signed)
Will give her rx for well butrin

## 2016-02-02 NOTE — Assessment & Plan Note (Signed)
She is better

## 2016-02-02 NOTE — Progress Notes (Signed)
   Subjective:    Patient ID: Samantha Fleming, female    DOB: 10-Jan-1981, 35 y.o.   MRN: [id number]  HPI 35 yo F with hx of HIV+, Hep C AB+ (Vl -), anxiety, PTSD, fibromyalgia, neuropahty. Was prev seen by DR Daryll Drown and changed to DTGV/descovy. No probs with this.   Has had fevers, chest congestion, sore throat for last 5 days. Has tried OTCs. Has been smoking less.  Would like to try Wellbutrin.  Is off OCP due to LE edema. Has lost weight since. Neuropathy has improved off OCPs. Constantly tired, sleeping.   HIV 1 RNA QUANT (copies/mL)  Date Value  01/17/2016 <20  09/15/2015 <20  06/29/2015 <20   CD4 T CELL ABS (/uL)  Date Value  01/17/2016 640  09/15/2015 530  06/29/2015 580   Refuses flu shot and other vaccines.    Review of Systems  Constitutional: Positive for fever, chills and unexpected weight change. Negative for appetite change.  HENT: Positive for sore throat.   Respiratory: Negative for cough.   Gastrointestinal: Negative for diarrhea and constipation.  Genitourinary: Positive for menstrual problem. Negative for difficulty urinating.       Objective:   Physical Exam  Constitutional: She appears well-developed and well-nourished.  HENT:  Mouth/Throat: No oropharyngeal exudate.  Eyes: EOM are normal. Pupils are equal, round, and reactive to light.  Neck: Neck supple.  Cardiovascular: Normal rate, regular rhythm and normal heart sounds.   Pulmonary/Chest: Effort normal and breath sounds normal.  Abdominal: Soft. Bowel sounds are normal. There is no tenderness. There is no rebound.  Musculoskeletal: She exhibits no edema.  Lymphadenopathy:    She has no cervical adenopathy.      Assessment & Plan:

## 2016-02-07 ENCOUNTER — Ambulatory Visit: Payer: Self-pay | Admitting: Infectious Disease

## 2016-02-11 ENCOUNTER — Other Ambulatory Visit: Payer: Self-pay | Admitting: Infectious Diseases

## 2016-02-11 ENCOUNTER — Other Ambulatory Visit: Payer: Self-pay | Admitting: *Deleted

## 2016-02-11 ENCOUNTER — Telehealth: Payer: Self-pay | Admitting: *Deleted

## 2016-02-11 DIAGNOSIS — N760 Acute vaginitis: Secondary | ICD-10-CM

## 2016-02-11 MED ORDER — FLUCONAZOLE 100 MG PO TABS: 100.0000 mg | ORAL_TABLET | Freq: Every day | ORAL | Status: DC

## 2016-02-11 MED ORDER — FLUCONAZOLE 150 MG PO TABS: 150.0000 mg | ORAL_TABLET | Freq: Once | ORAL | Status: DC

## 2016-02-11 MED ORDER — FLUCONAZOLE 100 MG PO TABS
100.0000 mg | ORAL_TABLET | Freq: Every day | ORAL | Status: DC
Start: 2016-02-11 — End: 2018-03-22

## 2016-02-11 NOTE — Telephone Encounter (Signed)
done

## 2016-02-11 NOTE — Telephone Encounter (Signed)
Patient notified

## 2016-02-11 NOTE — Telephone Encounter (Signed)
Patient called c/o yeast infection and requesting Diflucan. Please add to med list if ok, thanks. Samantha Fleming

## 2016-04-15 ENCOUNTER — Emergency Department (HOSPITAL_COMMUNITY)
Admission: EM | Admit: 2016-04-15 | Discharge: 2016-04-15 | Disposition: A | Discharge status: Home / Self Care | Payer: Self-pay | Attending: Emergency Medicine | Admitting: Emergency Medicine

## 2016-04-15 ENCOUNTER — Emergency Department (HOSPITAL_COMMUNITY): Payer: Self-pay

## 2016-04-15 ENCOUNTER — Encounter (HOSPITAL_COMMUNITY): Payer: Self-pay | Admitting: Family Medicine

## 2016-04-15 DIAGNOSIS — X509XXA Other and unspecified overexertion or strenuous movements or postures, initial encounter: Secondary | ICD-10-CM | POA: Insufficient documentation

## 2016-04-15 DIAGNOSIS — Z79899 Other long term (current) drug therapy: Secondary | ICD-10-CM | POA: Insufficient documentation

## 2016-04-15 DIAGNOSIS — Z792 Long term (current) use of antibiotics: Secondary | ICD-10-CM | POA: Insufficient documentation

## 2016-04-15 DIAGNOSIS — Z8742 Personal history of other diseases of the female genital tract: Secondary | ICD-10-CM | POA: Insufficient documentation

## 2016-04-15 DIAGNOSIS — F411 Generalized anxiety disorder: Secondary | ICD-10-CM | POA: Insufficient documentation

## 2016-04-15 DIAGNOSIS — D849 Immunodeficiency, unspecified: Secondary | ICD-10-CM | POA: Insufficient documentation

## 2016-04-15 DIAGNOSIS — Y9289 Other specified places as the place of occurrence of the external cause: Secondary | ICD-10-CM | POA: Insufficient documentation

## 2016-04-15 DIAGNOSIS — M25512 Pain in left shoulder: Secondary | ICD-10-CM

## 2016-04-15 DIAGNOSIS — S4992XA Unspecified injury of left shoulder and upper arm, initial encounter: Secondary | ICD-10-CM | POA: Insufficient documentation

## 2016-04-15 DIAGNOSIS — F17210 Nicotine dependence, cigarettes, uncomplicated: Secondary | ICD-10-CM | POA: Insufficient documentation

## 2016-04-15 DIAGNOSIS — Y998 Other external cause status: Secondary | ICD-10-CM | POA: Insufficient documentation

## 2016-04-15 DIAGNOSIS — F329 Major depressive disorder, single episode, unspecified: Secondary | ICD-10-CM | POA: Insufficient documentation

## 2016-04-15 DIAGNOSIS — G4700 Insomnia, unspecified: Secondary | ICD-10-CM | POA: Insufficient documentation

## 2016-04-15 DIAGNOSIS — M797 Fibromyalgia: Secondary | ICD-10-CM | POA: Insufficient documentation

## 2016-04-15 DIAGNOSIS — Y9389 Activity, other specified: Secondary | ICD-10-CM | POA: Insufficient documentation

## 2016-04-15 DIAGNOSIS — Z8619 Personal history of other infectious and parasitic diseases: Secondary | ICD-10-CM | POA: Insufficient documentation

## 2016-04-15 DIAGNOSIS — Z21 Asymptomatic human immunodeficiency virus [HIV] infection status: Secondary | ICD-10-CM | POA: Insufficient documentation

## 2016-04-15 MED ORDER — HYDROCODONE-ACETAMINOPHEN 5-325 MG PO TABS: 1.0000 | ORAL_TABLET | Freq: Four times a day (QID) | ORAL | Status: DC | PRN

## 2016-04-15 NOTE — ED Notes (Signed)
Declined W/C at D/C and was escorted to lobby by RN. 

## 2016-04-15 NOTE — Discharge Instructions (Signed)
Continue ibuprofen as needed for pain and inflammation. Use the pain medication given to today only for severe pain as needed - This can make you very drowsy - please do not drink or drive on this medication Ice affected area (instructions below). Please call the orthopedic clinic listed first thing Monday morning to schedule an appointment if symptoms have not improved. Return to ER for any new or worsening symptoms, any additional concerns.  COLD THERAPY DIRECTIONS:  Ice or gel packs can be used to reduce both pain and swelling. Ice is the most helpful within the first 24 to 48 hours after an injury or flareup from overusing a muscle or joint.  Ice is effective, has very few side effects, and is safe for most people to use.   If you expose your skin to cold temperatures for too long or without the proper protection, you can damage your skin or nerves. Watch for signs of skin damage due to cold.   HOME CARE INSTRUCTIONS  Follow these tips to use ice and cold packs safely.  Place a dry or damp towel between the ice and skin. A damp towel will cool the skin more quickly, so you may need to shorten the time that the ice is used.  For a more rapid response, add gentle compression to the ice.  Ice for no more than 10 to 20 minutes at a time. The bonier the area you are icing, the less time it will take to get the benefits of ice.  Check your skin after 5 minutes to make sure there are no signs of a poor response to cold or skin damage.  Rest 20 minutes or more in between uses.  Once your skin is numb, you can end your treatment. You can test numbness by very lightly touching your skin. The touch should be so light that you do not see the skin dimple from the pressure of your fingertip. When using ice, most people will feel these normal sensations in this order: cold, burning, aching, and numbness.

## 2016-04-15 NOTE — ED Provider Notes (Signed)
CSN: [id number]     Arrival date & time 04/15/16  1250 History  By signing my name below, I, Placido SouLogan Joldersma, attest that this documentation has been prepared under the direction and in the presence of [facility] Center[facility] Pilcher Ward, PA-C. Electronically Signed: Placido SouLogan Joldersma, ED Scribe. 04/15/2016. 2:18 PM.   Chief Complaint  Patient presents with  . Shoulder Pain   The history is provided by the patient. No language interpreter was used.    HPI Comments: Samantha Fleming is a 35 y.o. female with a PMHx of HIV, hep c, fibromyalgia who is right hand dominant presents to the Emergency Department complaining of constant, moderate, left shoulder pain that worsens with movement x 3 days. She was throwing a ball to her dog and felt a pop in the region with a sudden onset of her pain which she states radiates from her left shoulder to her left elbow and back to her left scapular region. Pt notes taking ibuprofen for pain management without significant relief. 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 Pt denies a hx of injury or a SHx to her left shoulder. She denies numbness or any other associated symptoms at this time.   Past Medical History  Diagnosis Date  . History of hepatitis C 04/29/2015  . HIV disease (HCC) 04/29/2015  . PTSD (post-traumatic stress disorder) 04/29/2015  . Vaginitis and vulvovaginitis 04/29/2015  . Irregular menses 04/29/2015  . GAD (generalized anxiety disorder) 04/29/2015  . 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 History of intravenous drug use in remission   . Anxiety   . Depression   . Neuromuscular disorder (HCC)   . Smoker 04/29/2015  . Fibromyalgia 04/29/2015  . Left leg pain 09/15/2015  . Yeast infection 09/15/2015  . Reduced libido 09/15/2015  . Insomnia 09/15/2015  . Tremor 10/06/2015  . Pain of left calf 10/06/2015   History reviewed. No pertinent past surgical history. Family History  Problem Relation Age of Onset  . Fibromyalgia Mother   . Sudden  death Maternal Grandfather    Social History  Substance Use Topics  . Smoking status: Current Every Day Smoker -- 1.00 packs/day for 16 years    Types: Cigarettes  . Smokeless tobacco: Never Used  . Alcohol Use: No   OB History    Gravida Para Term Preterm AB TAB SAB Ectopic Multiple Living   7 1   4 2          Review of Systems  Musculoskeletal: Positive for arthralgias.  Skin: Negative for wound.  Allergic/Immunologic: Positive for immunocompromised state.  Neurological: Negative for numbness.   Allergies  Asa and Rilpivirine  Home Medications   Prior to Admission medications   Medication Sig Start Date End Date Taking? Authorizing Provider  azithromycin (ZITHROMAX) 500 MG tablet Take 1 tablet (500 mg total) by mouth daily. 02/02/16   Ginnie SmartJeffrey C Hatcher, MD  buPROPion [facility](WELLBUTRIN SR) 150 MG 12 hr tablet Take 1 tablet (150 mg total) by mouth 2 (two) times daily. 02/02/16   Ginnie SmartJeffrey C Hatcher, MD  DESCOVY 200-25 MG per tablet Take 1 tablet by mouth daily. 06/29/15   Randall Hissornelius N Van Dam, MD  dolutegravir (TIVICAY) 50 MG tablet Take 1 tablet (50 mg total) by mouth daily. 04/29/15   Randall Hissornelius N Van Dam, MD  fluconazole (DIFLUCAN) 150 MG tablet Take 1 tablet (150 mg total) by mouth once. 02/11/16   Ginnie SmartJeffrey C Hatcher, MD  HYDROcodone-acetaminophen (NORCO/VICODIN) 5-325 MG tablet Take 1  tablet by mouth every 6 (six) hours as needed for severe pain. 04/15/16   Chase Picket Ward, PA-C  traZODone (DESYREL) 50 MG tablet Take 1 tablet (50 mg total) by mouth at bedtime as needed for sleep. 04/29/15   Randall Hiss, MD   BP 115/81 mmHg  Pulse 86  Temp(Src) 98.1 F (36.7 C)  Resp 18  SpO2 97%  LMP 03/30/2016    Physical Exam  Constitutional: She is oriented to person, place, and time. She appears well-developed and well-nourished.  HENT:  Head: Normocephalic and atraumatic.  Eyes: EOM are normal.  Neck: Normal range of motion.  Cardiovascular: Normal rate, regular rhythm and normal heart  sounds.  Exam reveals no gallop and no friction rub.   No murmur heard. Pulmonary/Chest: Effort normal and breath sounds normal. No respiratory distress. She has no wheezes. She has no rales.  Abdominal: Soft.  Musculoskeletal: Normal range of motion.       Arms: Decreased range of motion secondary to pain. Tenderness to palpation as depicted in image. Positive Neer's with motion limited to approximately 80. 2+ radial pulses and sensation intact in median, ulnar, radial, and axillary nerve distributions. No overlying skin changes including erythema, ecchymosis, swelling or deformity.  Neurological: She is alert and oriented to person, place, and time.  Skin: Skin is warm and dry.  Psychiatric: She has a normal mood and affect.  Nursing note and vitals reviewed.   ED Course  Procedures  DIAGNOSTIC STUDIES: Oxygen Saturation is 97% on RA, normal by my interpretation.    COORDINATION OF CARE: 2:18 PM Discussed next steps with pt. She verbalized understanding and is agreeable with the plan.   Labs Review Labs Reviewed - No data to display  Imaging Review Dg Shoulder Left  04/15/2016  CLINICAL DATA:  Left shoulder pain.  Recent injury. EXAM: LEFT SHOULDER - 2+ VIEW COMPARISON:  None. FINDINGS: There is no evidence of fracture or dislocation. There is no evidence of arthropathy or other focal bone abnormality. Soft tissues are unremarkable. IMPRESSION: Negative. Electronically Signed   By: Delbert Phenix M.D.   On: 04/15/2016 13:51   I have personally reviewed and evaluated these images as part of my medical decision-making.   EKG Interpretation None      MDM   Final diagnoses:  Left shoulder pain   Samantha Fleming assents to ED for 3 day history of left shoulder pain. X-rays were obtained which were unremarkable. No gross deformity noted. No overlying skin changes. Neurovascularly intact. Patient was placed in sling in ED. Symptomatic home care instructions were discussed. Follow-up  with ortho strongly recommended - call first thing Monday morning. Return precautions were given and all questions were answered.  I personally performed the services described in this documentation, which was scribed in my presence. The recorded information has been reviewed and is accurate.   [facility] Ward, PA-C 04/15/16 1444  Cathren Laine, MD 04/16/16 [phone]

## 2016-04-15 NOTE — ED Notes (Signed)
Pt here for left shoulder pain. sts she was throwing a ball and heard something pop. Limited ROM

## 2016-04-28 ENCOUNTER — Other Ambulatory Visit: Payer: Self-pay | Admitting: Infectious Disease

## 2016-04-30 ENCOUNTER — Other Ambulatory Visit: Payer: Self-pay | Admitting: Infectious Disease

## 2016-04-30 ENCOUNTER — Other Ambulatory Visit: Payer: Self-pay | Admitting: Infectious Diseases

## 2016-04-30 DIAGNOSIS — B20 Human immunodeficiency virus [HIV] disease: Secondary | ICD-10-CM

## 2016-05-01 ENCOUNTER — Other Ambulatory Visit: Payer: Self-pay | Admitting: *Deleted

## 2016-05-01 DIAGNOSIS — B20 Human immunodeficiency virus [HIV] disease: Secondary | ICD-10-CM

## 2016-05-01 MED ORDER — DESCOVY 200-25 MG PO TABS: 1.0000 | ORAL_TABLET | Freq: Every day | ORAL | Status: DC

## 2016-05-05 ENCOUNTER — Other Ambulatory Visit: Payer: Self-pay | Admitting: Infectious Diseases

## 2016-06-09 ENCOUNTER — Other Ambulatory Visit: Payer: Self-pay | Admitting: *Deleted

## 2016-06-09 MED ORDER — FLUCONAZOLE 150 MG PO TABS: 150.0000 mg | ORAL_TABLET | Freq: Once | ORAL | Status: DC

## 2016-06-23 ENCOUNTER — Encounter (HOSPITAL_COMMUNITY): Payer: Self-pay | Admitting: Emergency Medicine

## 2016-06-23 ENCOUNTER — Emergency Department (HOSPITAL_COMMUNITY)
Admission: EM | Admit: 2016-06-23 | Discharge: 2016-06-23 | Disposition: A | Discharge status: Home / Self Care | Payer: Self-pay | Attending: Emergency Medicine | Admitting: Emergency Medicine

## 2016-06-23 DIAGNOSIS — F17210 Nicotine dependence, cigarettes, uncomplicated: Secondary | ICD-10-CM | POA: Insufficient documentation

## 2016-06-23 DIAGNOSIS — G8918 Other acute postprocedural pain: Secondary | ICD-10-CM | POA: Insufficient documentation

## 2016-06-23 DIAGNOSIS — K0889 Other specified disorders of teeth and supporting structures: Secondary | ICD-10-CM | POA: Insufficient documentation

## 2016-06-23 DIAGNOSIS — Z79899 Other long term (current) drug therapy: Secondary | ICD-10-CM | POA: Insufficient documentation

## 2016-06-23 MED ORDER — AMOXICILLIN 500 MG PO CAPS: 500.0000 mg | ORAL_CAPSULE | Freq: Three times a day (TID) | ORAL | Status: DC

## 2016-06-23 MED ORDER — HYDROCODONE-ACETAMINOPHEN 5-325 MG PO TABS: 1.0000 | ORAL_TABLET | ORAL | Status: DC | PRN

## 2016-06-23 MED ORDER — SODIUM CHLORIDE 0.9 % IV BOLUS (SEPSIS)
1000.0000 mL | Freq: Once | INTRAVENOUS | Status: AC
  Administered 2016-06-23: 1000 mL via INTRAVENOUS

## 2016-06-23 MED ORDER — CEFAZOLIN IN D5W 1 GM/50ML IV SOLN
1.0000 g | Freq: Once | INTRAVENOUS | Status: AC
  Administered 2016-06-23: 1 g via INTRAVENOUS
  Filled 2016-06-23: qty 50

## 2016-06-23 MED ORDER — MORPHINE SULFATE (PF) 4 MG/ML IV SOLN
4.0000 mg | Freq: Once | INTRAVENOUS | Status: AC
  Administered 2016-06-23: 4 mg via INTRAVENOUS
  Filled 2016-06-23: qty 1

## 2016-06-23 NOTE — Discharge Instructions (Signed)
Please read and follow all provided instructions.  Your diagnoses today include:  1. Post-op pain    Tests performed today include:  Vital signs. See below for your results today.   Medications prescribed:   Take as prescribed   Home care instructions:  Follow any educational materials contained in this packet.  Follow-up instructions: Please follow-up with Oral Surgery for further evaluation of symptoms and treatment. Dr. Martie RoundMilner will call and arrange this appointment   Return instructions:   Please return to the Emergency Department if you do not get better, if you get worse, or new symptoms OR  - Fever (temperature greater than 101.1F)  - Bleeding that does not stop with holding pressure to the area    -Severe pain (please note that you may be more sore the day after your accident)  - Chest Pain  - Difficulty breathing  - Severe nausea or vomiting  - Inability to tolerate food and liquids  - Passing out  - Skin becoming red around your wounds  - Change in mental status (confusion or lethargy)  - New numbness or weakness     Please return if you have any other emergent concerns.  Additional Information:  Your vital signs today were: BP 122/84 mmHg   Pulse 79   Temp(Src)    Resp 18   SpO2 100% If your blood pressure (BP) was elevated above 135/85 this visit, please have this repeated by your doctor within one month. ---------------

## 2016-06-23 NOTE — ED Notes (Signed)
Pt in from home after L lower wisdom tooth extraction on 6/28. Pt reported having sharp shooting pain, swelling and remaining L sided face numbness at 0500 this am. Pt reports having trouble swallowing, is very anxious. Pt has been taking Norco for pain, pain currently 10/10.

## 2016-06-23 NOTE — ED Notes (Signed)
Ice pack applied to L face

## 2016-06-23 NOTE — ED Provider Notes (Signed)
CSN: [id number]     Arrival date & time 06/23/16  [id number] History   First MD Initiated Contact with Patient 06/23/16 1001     Chief Complaint  Patient presents with  . Dental Injury   (Consider location/radiation/quality/duration/timing/severity/associated sxs/prior Treatment) HPI 35 y.o. female presents to the Emergency Department today complaining of left jaw pain since 06-21-16. States that she had her wisdom teeth extracted by Dr. Martie RoundMilner on the left side. Noted swelling over the area for the past few days with numbness that has not resolved. States that she is having a shooting pain on the left side as well since 0500 this AM. Pain is 10/10. Pt states that it hurts to eat, but is still able to tolerate PO. No fevers. Pt able to ROM neck. No N/V. No CP/SOB/ABD pain. No other symptoms noted.      Past Medical History  Diagnosis Date  . History of hepatitis C 04/29/2015  . HIV disease (HCC) 04/29/2015  . PTSD (post-traumatic stress disorder) 04/29/2015  . Vaginitis and vulvovaginitis 04/29/2015  . Irregular menses 04/29/2015  . GAD (generalized anxiety disorder) 04/29/2015  . 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 History of intravenous drug use in remission   . Anxiety   . Depression   . Neuromuscular disorder (HCC)   . Smoker 04/29/2015  . Fibromyalgia 04/29/2015  . Left leg pain 09/15/2015  . Yeast infection 09/15/2015  . Reduced libido 09/15/2015  . Insomnia 09/15/2015  . Tremor 10/06/2015  . Pain of left calf 10/06/2015   No past surgical history on file. Family History  Problem Relation Age of Onset  . Fibromyalgia Mother   . Sudden death Maternal Grandfather    Social History  Substance Use Topics  . Smoking status: Current Every Day Smoker -- 1.00 packs/day for 16 years    Types: Cigarettes  . Smokeless tobacco: Never Used  . Alcohol Use: No   OB History    Gravida Para Term Preterm AB TAB SAB Ectopic Multiple Living   7 1   4 2          Review of Systems ROS  reviewed and all are negative for acute change except as noted in the HPI.  Allergies  Asa and Rilpivirine  Home Medications   Prior to Admission medications   Medication Sig Start Date End Date Taking? Authorizing Provider  azithromycin (ZITHROMAX) 500 MG tablet Take 1 tablet (500 mg total) by mouth daily. 02/02/16   Ginnie SmartJeffrey C Hatcher, MD  buPROPion (WELLBUTRIN SR) 150 MG 12 hr tablet TAKE 1 TABLET(150 MG) BY MOUTH TWICE DAILY 05/08/16   Ginnie SmartJeffrey C Hatcher, MD  DESCOVY 200-25 MG tablet Take 1 tablet by mouth daily. 05/01/16   Randall Hissornelius N Van Dam, MD  fluconazole (DIFLUCAN) 150 MG tablet Take 1 tablet (150 mg total) by mouth once. 06/09/16   Ginnie SmartJeffrey C Hatcher, MD  HYDROcodone-acetaminophen (NORCO/VICODIN) 5-325 MG tablet Take 1 tablet by mouth every 6 (six) hours as needed for severe pain. 04/15/16   Jaime Pilcher Ward, PA-C  TIVICAY 50 MG tablet TAKE 1 TABLET(50 MG) BY MOUTH DAILY 05/01/16   Ginnie SmartJeffrey C Hatcher, MD  traZODone (DESYREL) 50 MG tablet TAKE 1 TABLET(50 MG) BY MOUTH AT BEDTIME AS NEEDED FOR SLEEP 04/28/16   Randall Hissornelius N Van Dam, MD   BP 127/98 mmHg  Pulse 82  Temp(Src)   Resp 18  SpO2 100%   Physical Exam  Constitutional: She is oriented to person, place, and time. She  appears well-developed and well-nourished.  HENT:  Head: Normocephalic and atraumatic.  Mouth/Throat: Uvula is midline, oropharynx is clear and moist and mucous membranes are normal. No trismus in the jaw. Abnormal dentition. No dental abscesses or uvula swelling.  Sutures noted on left lower mandible where wisdom teeth would have been. Mild swelling. No obvious infection. No trismus. Pt able to phonate well. No uvula deviation. ROM of neck intact.   Eyes: EOM are normal. Pupils are equal, round, and reactive to light.  Neck: Normal range of motion. Neck supple. No tracheal deviation present.  Cardiovascular: Normal rate, regular rhythm, normal heart sounds and intact distal pulses.   Pulmonary/Chest: Effort normal and  breath sounds normal.  Abdominal: Soft.  Musculoskeletal: Normal range of motion.  Neurological: She is alert and oriented to person, place, and time.  Skin: Skin is warm and dry.  Psychiatric: She has a normal mood and affect. Her behavior is normal. Thought content normal.  Nursing note and vitals reviewed.  ED Course  Procedures (including critical care time) Labs Review Labs Reviewed - No data to display  Imaging Review No results found. I have personally reviewed and evaluated these images and lab results as part of my medical decision-making.   EKG Interpretation None      MDM  I have reviewed the relevant previous healthcare records. I obtained HPI from historian. Patient discussed with supervising physician  ED Course:  Assessment: Pt is a 34yF who presents with left lower mandible swelling since Wisdom tooth extraction on 06-21-16. Pt worried about numbness and swelling. Able to tolerate PO. On exam, pt anxious. Nontoxic/nonseptic appearing. VSS. Afebrile. Lungs CTA. Heart RRR. Lower mandible with no obvious infection. Mild swelling noted. No trismus. No uvula deviation. Full ROM of neck. Pt able to tolerate PO. Witnessed taking medications and drinking fluids in ED. Spoke with Dentist (see below). Plan is to DC home with Amoxicillin 500mg  TID for 10 days. Given Rx Norco #6 and follow up on Monday with Oral surgeon. At time of discharge, Patient is in no acute distress. Vital Signs are stable. Patient is able to ambulate. Patient able to tolerate PO.    10:25 AM- Spoke with Dr. Jodie EchevariaBill Milner (Dentist) over the phone and discussed patient presentation. Recommended IV Ancef, NS Bolus. Will send home with Amoxicillin and follow up with Oral Surgeon that Dr. Martie RoundMilner will schedule.   12:05 PM- Pt symptoms improved with fluids, ABX and analgesia. Pt comfortable with discharge and will follow up with Dr. Martie RoundMilner. At time of discharge, Patient is in no acute distress. Vital Signs are  stable. Patient is able to ambulate. Patient able to tolerate PO.   Disposition/Plan:  DC Home Additional Verbal discharge instructions given and discussed with patient.  Pt Instructed to f/u with Oral Surgery in the next week for evaluation and treatment of symptoms. Return precautions given Pt acknowledges and agrees with plan  Supervising Physician Jerelyn ScottMartha Linker, MD   Final diagnoses:  Post-op pain  Pain, dental     Audry Piliyler Brinsley Wence, PA-C 06/23/16 [phone]  Jerelyn ScottMartha Linker, MD 06/23/16 1213

## 2016-06-29 ENCOUNTER — Other Ambulatory Visit: Payer: Self-pay

## 2016-06-29 ENCOUNTER — Ambulatory Visit: Payer: Self-pay | Admitting: *Deleted

## 2016-06-29 DIAGNOSIS — F1590 Other stimulant use, unspecified, uncomplicated: Secondary | ICD-10-CM

## 2016-06-29 MED ORDER — BUPROPION HCL ER (SR) 150 MG PO TB12: ORAL_TABLET | ORAL | Status: DC

## 2016-06-29 NOTE — BH Specialist Note (Signed)
[name] showed for her scheduled appointment today with counselor.  Patient was oriented times four with good affect and dress.  Patient was alert and talkative.  Patient immediately stated that she was not sure why she was in the counseling office that she thought she was scheduled an appointment for lab work.  Counselor explained that she was asked to be seen by a substance abuse counselor because of the possible need for pain medication for her dental problems.  Counselor further shared that with her history of addiction it was imperative that this she get some counseling to avoid potential relapse. Patient was not happy and expressed that she was wasting her time.  Counselor brought [employer]Michelle (nurse) into the discussion to assist. Counselor and nurse provided support and encouragement.  Counselor reassured patient and recommended that she make an appointment to discuss other issues. It was determined that patient did not require substance abuse treatment but could definitely benefit from some counseling for her anxiety.  Patient agreed and said she would make an appointment when she checked out today.   Samantha LucksJodi Allyn Bertoni, MA, LPC [facility]

## 2016-06-30 LAB — CBC WITH DIFFERENTIAL/PLATELET
BASOS PCT: 2 %
Basophils Absolute: 70 cells/uL (ref 0–200)
EOS PCT: 1 %
Eosinophils Absolute: 35 cells/uL (ref 15–500)
HCT: 39.2 % (ref 35.0–45.0)
Hemoglobin: 13 g/dL (ref 11.7–15.5)
LYMPHS PCT: 31 %
Lymphs Abs: 1085 cells/uL (ref 850–3900)
MCH: 32 pg (ref 27.0–33.0)
MCHC: 33.2 g/dL (ref 32.0–36.0)
MCV: 96.6 fL (ref 80.0–100.0)
MONOS PCT: 10 %
MPV: 10.6 fL (ref 7.5–12.5)
Monocytes Absolute: 350 cells/uL (ref 200–950)
NEUTROS ABS: 1960 {cells}/uL (ref 1500–7800)
Neutrophils Relative %: 56 %
PLATELETS: 210 10*3/uL (ref 140–400)
RBC: 4.06 MIL/uL (ref 3.80–5.10)
RDW: 13.7 % (ref 11.0–15.0)
WBC: 3.5 10*3/uL — AB (ref 3.8–10.8)

## 2016-06-30 LAB — COMPLETE METABOLIC PANEL WITH GFR
ALK PHOS: 33 U/L (ref 33–115)
ALT: 8 U/L (ref 6–29)
AST: 12 U/L (ref 10–30)
Albumin: 4.6 g/dL (ref 3.6–5.1)
BILIRUBIN TOTAL: 0.4 mg/dL (ref 0.2–1.2)
BUN: 12 mg/dL (ref 7–25)
CALCIUM: 9.3 mg/dL (ref 8.6–10.2)
CO2: 25 mmol/L (ref 20–31)
CREATININE: 0.9 mg/dL (ref 0.50–1.10)
Chloride: 105 mmol/L (ref 98–110)
GFR, EST NON AFRICAN AMERICAN: 84 mL/min (ref >=60)
Glucose, Bld: 84 mg/dL (ref 65–99)
Potassium: 4.7 mmol/L (ref 3.5–5.3)
Sodium: 140 mmol/L (ref 135–146)
TOTAL PROTEIN: 6.8 g/dL (ref 6.1–8.1)

## 2016-06-30 LAB — LIPID PANEL
CHOLESTEROL: 175 mg/dL (ref 125–200)
HDL: 53 mg/dL (ref >=46)
LDL CALC: 105 mg/dL (ref <130)
TRIGLYCERIDES: 87 mg/dL (ref <150)
Total CHOL/HDL Ratio: 3.3 Ratio (ref <=5.0)
VLDL: 17 mg/dL (ref <30)

## 2016-06-30 LAB — T-HELPER CELL (CD4) - (RCID CLINIC ONLY)
CD4 T CELL HELPER: 41 % (ref 33–55)
CD4 T Cell Abs: 460 /uL (ref 400–2700)

## 2016-06-30 LAB — URINE CYTOLOGY ANCILLARY ONLY
CHLAMYDIA, DNA PROBE: NEGATIVE
Neisseria Gonorrhea: NEGATIVE

## 2016-06-30 LAB — RPR

## 2016-07-02 LAB — HIV-1 RNA QUANT-NO REFLEX-BLD: HIV-1 RNA Quant, Log: <1.3 Log copies/mL (ref <1.30)

## 2016-07-04 ENCOUNTER — Telehealth: Payer: Self-pay | Admitting: *Deleted

## 2016-07-04 NOTE — Telephone Encounter (Signed)
Patient called for lab results. Relayed results. Patient saw dental today for follow up. Reports she was told it is "healing well" but her pain has not diminished. The numbness is slightly better, which she feels leads to increased awareness of the pain and throbbing. Patient reports she is following Dr. Janyce LlanosMilner's orders, has asked for the previously-offered MRI as follow up. Andree CossHowell, Daanya Lanphier M, RN

## 2016-08-03 ENCOUNTER — Telehealth: Payer: Self-pay | Admitting: *Deleted

## 2016-08-03 NOTE — Telephone Encounter (Signed)
Requesting RN Marcelino DusterMichelle to call her today.  This RN offered to assist the patient but she wanted to discuss her problem with RN Marcelino DusterMichelle.  Will send message to [employer] [name].

## 2016-08-04 NOTE — Telephone Encounter (Signed)
Patient called for advice. She has had a positive pregnancy test at home, is trying to schedule an appointment for abortion. She wants to know if prophylactic antibiotics are recommended by current guidelines. Per Dr. Ninetta LightsHatcher, they are not. He advised that as long as she sees a licensed provider, they should follow standard protocols.  RN relayed information to patient. Andree CossHowell, Darrian Goodwill M, RN

## 2016-08-17 ENCOUNTER — Encounter: Payer: Self-pay | Admitting: Infectious Disease

## 2016-09-16 ENCOUNTER — Ambulatory Visit (HOSPITAL_COMMUNITY)
Admission: EM | Admit: 2016-09-16 | Discharge: 2016-09-16 | Disposition: A | Discharge status: Home / Self Care | Payer: Self-pay | Attending: Obstetrics and Gynecology | Admitting: Obstetrics and Gynecology

## 2016-09-16 ENCOUNTER — Encounter (HOSPITAL_COMMUNITY): Payer: Self-pay | Admitting: Emergency Medicine

## 2016-09-16 ENCOUNTER — Inpatient Hospital Stay (HOSPITAL_COMMUNITY): Payer: Self-pay | Admitting: Anesthesiology

## 2016-09-16 ENCOUNTER — Encounter (HOSPITAL_COMMUNITY)
Admission: EM | Disposition: A | Discharge status: Home / Self Care | Payer: Self-pay | Source: Home / Self Care | Attending: Physician Assistant

## 2016-09-16 ENCOUNTER — Emergency Department (HOSPITAL_COMMUNITY): Payer: Self-pay

## 2016-09-16 DIAGNOSIS — Z21 Asymptomatic human immunodeficiency virus [HIV] infection status: Secondary | ICD-10-CM | POA: Insufficient documentation

## 2016-09-16 DIAGNOSIS — N939 Abnormal uterine and vaginal bleeding, unspecified: Secondary | ICD-10-CM

## 2016-09-16 DIAGNOSIS — O034 Incomplete spontaneous abortion without complication: Secondary | ICD-10-CM | POA: Insufficient documentation

## 2016-09-16 HISTORY — PX: DILATION AND EVACUATION: SHX1459

## 2016-09-16 LAB — BASIC METABOLIC PANEL
ANION GAP: 6 (ref 5–15)
BUN: 13 mg/dL (ref 6–20)
CHLORIDE: 107 mmol/L (ref 101–111)
CO2: 24 mmol/L (ref 22–32)
Calcium: 9.1 mg/dL (ref 8.9–10.3)
Creatinine, Ser: 0.7 mg/dL (ref 0.44–1.00)
GFR calc Af Amer: >60 mL/min (ref >60)
GLUCOSE: 90 mg/dL (ref 65–99)
POTASSIUM: 4.2 mmol/L (ref 3.5–5.1)
Sodium: 137 mmol/L (ref 135–145)

## 2016-09-16 LAB — ABO/RH: ABO/RH(D): B POS

## 2016-09-16 LAB — CBC
HCT: 35.6 % — ABNORMAL LOW (ref 36.0–46.0)
HEMOGLOBIN: 12 g/dL (ref 12.0–15.0)
MCH: 32.5 pg (ref 26.0–34.0)
MCHC: 33.7 g/dL (ref 30.0–36.0)
MCV: 96.5 fL (ref 78.0–100.0)
PLATELETS: 162 10*3/uL (ref 150–400)
RBC: 3.69 MIL/uL — AB (ref 3.87–5.11)
RDW: 11.8 % (ref 11.5–15.5)
WBC: 5.8 10*3/uL (ref 4.0–10.5)

## 2016-09-16 LAB — WET PREP, GENITAL
CLUE CELLS WET PREP: NONE SEEN
SPERM: NONE SEEN
Trich, Wet Prep: NONE SEEN
YEAST WET PREP: NONE SEEN

## 2016-09-16 LAB — I-STAT BETA HCG BLOOD, ED (MC, WL, AP ONLY): I-stat hCG, quantitative: 1790.7 m[IU]/mL — ABNORMAL HIGH (ref <5)

## 2016-09-16 LAB — TYPE AND SCREEN
ABO/RH(D): B POS
Antibody Screen: NEGATIVE

## 2016-09-16 SURGERY — DILATION AND EVACUATION, UTERUS
Anesthesia: Monitor Anesthesia Care | Site: Vagina

## 2016-09-16 MED ORDER — SUCCINYLCHOLINE CHLORIDE 200 MG/10ML IV SOSY
PREFILLED_SYRINGE | INTRAVENOUS | Status: AC
  Filled 2016-09-16: qty 10

## 2016-09-16 MED ORDER — FENTANYL CITRATE (PF) 100 MCG/2ML IJ SOLN
INTRAMUSCULAR | Status: AC
  Filled 2016-09-16: qty 2

## 2016-09-16 MED ORDER — MISOPROSTOL 200 MCG PO TABS
ORAL_TABLET | ORAL | Status: AC
  Filled 2016-09-16: qty 1

## 2016-09-16 MED ORDER — FENTANYL CITRATE (PF) 100 MCG/2ML IJ SOLN
25.0000 ug | Freq: Once | INTRAMUSCULAR | Status: AC
  Administered 2016-09-16: 25 ug via INTRAVENOUS
  Filled 2016-09-16: qty 2

## 2016-09-16 MED ORDER — PROPOFOL 10 MG/ML IV BOLUS
INTRAVENOUS | Status: AC
  Filled 2016-09-16: qty 20

## 2016-09-16 MED ORDER — SOD CITRATE-CITRIC ACID 500-334 MG/5ML PO SOLN
30.0000 mL | Freq: Once | ORAL | Status: AC
  Administered 2016-09-16: 30 mL via ORAL

## 2016-09-16 MED ORDER — MISOPROSTOL 200 MCG PO TABS
200.0000 ug | ORAL_TABLET | Freq: Once | ORAL | Status: AC
  Administered 2016-09-16: 200 ug via RECTAL
  Filled 2016-09-16: qty 1

## 2016-09-16 MED ORDER — ONDANSETRON HCL 4 MG/2ML IJ SOLN
4.0000 mg | Freq: Once | INTRAMUSCULAR | Status: AC
  Administered 2016-09-16: 4 mg via INTRAVENOUS
  Filled 2016-09-16: qty 2

## 2016-09-16 MED ORDER — ONDANSETRON HCL 4 MG/2ML IJ SOLN
INTRAMUSCULAR | Status: DC | PRN
  Administered 2016-09-16: 4 mg via INTRAVENOUS

## 2016-09-16 MED ORDER — DEXAMETHASONE SODIUM PHOSPHATE 4 MG/ML IJ SOLN
INTRAMUSCULAR | Status: AC
  Filled 2016-09-16: qty 1

## 2016-09-16 MED ORDER — DOXYCYCLINE HYCLATE 100 MG IV SOLR
200.0000 mg | INTRAVENOUS | Status: DC
  Filled 2016-09-16: qty 200

## 2016-09-16 MED ORDER — SCOPOLAMINE 1 MG/3DAYS TD PT72
MEDICATED_PATCH | TRANSDERMAL | Status: AC
  Filled 2016-09-16: qty 1

## 2016-09-16 MED ORDER — KETOROLAC TROMETHAMINE 30 MG/ML IJ SOLN
30.0000 mg | Freq: Once | INTRAMUSCULAR | Status: AC
  Administered 2016-09-16: 30 mg via INTRAVENOUS
  Filled 2016-09-16: qty 1

## 2016-09-16 MED ORDER — HYDROMORPHONE HCL 1 MG/ML IJ SOLN
1.0000 mg | Freq: Once | INTRAMUSCULAR | Status: AC
  Administered 2016-09-16: 1 mg via INTRAVENOUS
  Filled 2016-09-16: qty 1

## 2016-09-16 MED ORDER — MIDAZOLAM HCL 2 MG/2ML IJ SOLN
INTRAMUSCULAR | Status: AC
  Filled 2016-09-16: qty 2

## 2016-09-16 MED ORDER — MIDAZOLAM HCL 5 MG/5ML IJ SOLN
INTRAMUSCULAR | Status: DC | PRN
  Administered 2016-09-16 (×2): 1 mg via INTRAVENOUS
  Administered 2016-09-16: 2 mg via INTRAVENOUS

## 2016-09-16 MED ORDER — LIDOCAINE 2% (20 MG/ML) 5 ML SYRINGE
INTRAMUSCULAR | Status: DC | PRN
  Administered 2016-09-16 (×2): 50 mg via INTRAVENOUS

## 2016-09-16 MED ORDER — HYDROCODONE-ACETAMINOPHEN 5-325 MG PO TABS: 1.0000 | ORAL_TABLET | Freq: Four times a day (QID) | ORAL | 0 refills | Status: DC | PRN

## 2016-09-16 MED ORDER — ONDANSETRON HCL 4 MG/2ML IJ SOLN
INTRAMUSCULAR | Status: AC
  Filled 2016-09-16: qty 2

## 2016-09-16 MED ORDER — SODIUM CHLORIDE 0.9 % IV BOLUS (SEPSIS)
1000.0000 mL | Freq: Once | INTRAVENOUS | Status: AC
  Administered 2016-09-16: 1000 mL via INTRAVENOUS

## 2016-09-16 MED ORDER — KETOROLAC TROMETHAMINE 30 MG/ML IJ SOLN
INTRAMUSCULAR | Status: DC | PRN
  Administered 2016-09-16: 30 mg via INTRAVENOUS

## 2016-09-16 MED ORDER — BUPIVACAINE HCL 0.25 % IJ SOLN
INTRAMUSCULAR | Status: DC | PRN
  Administered 2016-09-16: 10 mL

## 2016-09-16 MED ORDER — LACTATED RINGERS IV BOLUS (SEPSIS)
1000.0000 mL | Freq: Once | INTRAVENOUS | Status: AC
  Administered 2016-09-16: 1000 mL via INTRAVENOUS

## 2016-09-16 MED ORDER — FAMOTIDINE IN NACL 20-0.9 MG/50ML-% IV SOLN
20.0000 mg | Freq: Once | INTRAVENOUS | Status: AC
  Administered 2016-09-16: 20 mg via INTRAVENOUS

## 2016-09-16 MED ORDER — OXYCODONE HCL 5 MG/5ML PO SOLN: 5.0000 mg | Freq: Once | ORAL | Status: DC | PRN

## 2016-09-16 MED ORDER — SOD CITRATE-CITRIC ACID 500-334 MG/5ML PO SOLN
ORAL | Status: AC
  Filled 2016-09-16: qty 15

## 2016-09-16 MED ORDER — IBUPROFEN 800 MG PO TABS: 800.0000 mg | ORAL_TABLET | Freq: Three times a day (TID) | ORAL | 0 refills | Status: DC | PRN

## 2016-09-16 MED ORDER — LACTATED RINGERS IV SOLN
INTRAVENOUS | Status: DC
  Administered 2016-09-16: 21:00:00 via INTRAVENOUS

## 2016-09-16 MED ORDER — DEXAMETHASONE SODIUM PHOSPHATE 4 MG/ML IJ SOLN
INTRAMUSCULAR | Status: DC | PRN
  Administered 2016-09-16: 4 mg via INTRAVENOUS

## 2016-09-16 MED ORDER — PROPOFOL 10 MG/ML IV BOLUS
INTRAVENOUS | Status: DC | PRN
  Administered 2016-09-16: 30 mg via INTRAVENOUS
  Administered 2016-09-16: 50 mg via INTRAVENOUS
  Administered 2016-09-16: 20 mg via INTRAVENOUS
  Administered 2016-09-16: 50 mg via INTRAVENOUS

## 2016-09-16 MED ORDER — SOD CITRATE-CITRIC ACID 500-334 MG/5ML PO SOLN: 30.0000 mL | ORAL | Status: DC

## 2016-09-16 MED ORDER — FAMOTIDINE IN NACL 20-0.9 MG/50ML-% IV SOLN
INTRAVENOUS | Status: AC
  Filled 2016-09-16: qty 50

## 2016-09-16 MED ORDER — LIDOCAINE HCL (CARDIAC) 20 MG/ML IV SOLN
INTRAVENOUS | Status: AC
  Filled 2016-09-16: qty 5

## 2016-09-16 MED ORDER — FENTANYL CITRATE (PF) 100 MCG/2ML IJ SOLN
25.0000 ug | INTRAMUSCULAR | Status: DC | PRN
  Administered 2016-09-16 (×2): 50 ug via INTRAVENOUS

## 2016-09-16 MED ORDER — FENTANYL CITRATE (PF) 100 MCG/2ML IJ SOLN
INTRAMUSCULAR | Status: DC | PRN
  Administered 2016-09-16 (×2): 50 ug via INTRAVENOUS

## 2016-09-16 MED ORDER — OXYCODONE HCL 5 MG PO TABS: 5.0000 mg | ORAL_TABLET | Freq: Once | ORAL | Status: DC | PRN

## 2016-09-16 MED ORDER — ONDANSETRON HCL 4 MG/2ML IJ SOLN
4.0000 mg | Freq: Once | INTRAMUSCULAR | Status: DC | PRN
Start: 2016-09-16 — End: 2016-09-17

## 2016-09-16 SURGICAL SUPPLY — 19 items
CATH ROBINSON RED A/P 16FR (CATHETERS) ×3 IMPLANT
CLOTH BEACON ORANGE TIMEOUT ST (SAFETY) ×3 IMPLANT
DECANTER SPIKE VIAL GLASS SM (MISCELLANEOUS) ×3 IMPLANT
GLOVE BIO SURGEON STRL SZ7.5 (GLOVE) ×3 IMPLANT
GLOVE BIOGEL PI IND STRL 7.0 (GLOVE) ×1 IMPLANT
GLOVE BIOGEL PI INDICATOR 7.0 (GLOVE) ×2
GOWN STRL REUS W/TWL LRG LVL3 (GOWN DISPOSABLE) ×3 IMPLANT
GOWN STRL REUS W/TWL XL LVL3 (GOWN DISPOSABLE) ×3 IMPLANT
KIT BERKELEY 1ST TRIMESTER 3/8 (MISCELLANEOUS) ×3 IMPLANT
NS IRRIG 1000ML POUR BTL (IV SOLUTION) ×3 IMPLANT
PACK VAGINAL MINOR WOMEN LF (CUSTOM PROCEDURE TRAY) ×3 IMPLANT
PAD OB MATERNITY 4.3X12.25 (PERSONAL CARE ITEMS) ×3 IMPLANT
PAD PREP 24X48 CUFFED NSTRL (MISCELLANEOUS) ×3 IMPLANT
SET BERKELEY SUCTION TUBING (SUCTIONS) ×3 IMPLANT
TOWEL OR 17X24 6PK STRL BLUE (TOWEL DISPOSABLE) ×6 IMPLANT
VACURETTE 10 RIGID CVD (CANNULA) IMPLANT
VACURETTE 7MM CVD STRL WRAP (CANNULA) IMPLANT
VACURETTE 8 RIGID CVD (CANNULA) IMPLANT
VACURETTE 9 RIGID CVD (CANNULA) IMPLANT

## 2016-09-16 NOTE — Anesthesia Postprocedure Evaluation (Signed)
Anesthesia Post Note  Patient: Samantha RosenthalBrittany Fleming  Procedure(s) Performed: Procedure(s) (LRB): DILATATION AND EVACUATION (N/A)  Patient location during evaluation: PACU Anesthesia Type: General Level of consciousness: awake and alert Pain management: pain level controlled Vital Signs Assessment: post-procedure vital signs reviewed and stable Respiratory status: spontaneous breathing, nonlabored ventilation, respiratory function stable and patient connected to nasal cannula oxygen Cardiovascular status: blood pressure returned to baseline and stable Postop Assessment: no signs of nausea or vomiting Anesthetic complications: no     Last Vitals:  Vitals:   09/16/16 2145 09/16/16 2200  BP: 109/72 106/75  Pulse: 71 69  Resp: 14 16  Temp: 36.6 C     Last Pain:  Vitals:   09/16/16 2130  TempSrc:   PainSc: 8    Pain Goal: Patients Stated Pain Goal: 3 (09/16/16 2200)               Ishia Tenorio JENNETTE

## 2016-09-16 NOTE — Anesthesia Preprocedure Evaluation (Signed)
Anesthesia Evaluation  Patient identified by MRN, date of birth, ID band Patient awake    Reviewed: Allergy & Precautions, NPO status , Patient's Chart, lab work & pertinent test results  History of Anesthesia Complications Negative for: history of anesthetic complications  Airway Mallampati: II  TM Distance: >3 FB Neck ROM: Full    Dental no notable dental hx. (+) Dental Advisory Given, Poor Dentition   Pulmonary Current Smoker,    Pulmonary exam normal breath sounds clear to auscultation       Cardiovascular negative cardio ROS Normal cardiovascular exam Rhythm:Regular Rate:Normal     Neuro/Psych PSYCHIATRIC DISORDERS Anxiety Depression negative neurological ROS     GI/Hepatic negative GI ROS, (+)     substance abuse (denies any recent other substance use >1year per patient)  marijuana use, Hepatitis -, C  Endo/Other  negative endocrine ROS  Renal/GU negative Renal ROS  negative genitourinary   Musculoskeletal  (+) Fibromyalgia -  Abdominal   Peds negative pediatric ROS (+)  Hematology  (+) HIV,   Anesthesia Other Findings   Reproductive/Obstetrics Retained products                             Anesthesia Physical Anesthesia Plan  ASA: III  Anesthesia Plan: MAC   Post-op Pain Management:    Induction: Intravenous  Airway Management Planned:   Additional Equipment:   Intra-op Plan:   Post-operative Plan:   Informed Consent: I have reviewed the patients History and Physical, chart, labs and discussed the procedure including the risks, benefits and alternatives for the proposed anesthesia with the patient or authorized representative who has indicated his/her understanding and acceptance.   Dental advisory given  Plan Discussed with: CRNA  Anesthesia Plan Comments:         Anesthesia Quick Evaluation

## 2016-09-16 NOTE — ED Notes (Signed)
Pt returned from US

## 2016-09-16 NOTE — Transfer of Care (Signed)
Immediate Anesthesia Transfer of Care Note  Patient: Samantha Fleming  Procedure(s) Performed: Procedure(s): DILATATION AND EVACUATION (N/A)  Patient Location: PACU  Anesthesia Type:MAC  Level of Consciousness: awake, alert , oriented and patient cooperative  Airway & Oxygen Therapy: Patient Spontanous Breathing  Post-op Assessment: Report given to RN and Post -op Vital signs reviewed and stable  Post vital signs: Reviewed and stable  Last Vitals:  Vitals:   09/16/16 1820 09/16/16 1944  BP: 115/64 109/62  Pulse: 62 78  Resp: 16 16  Temp: 36.8 C 36.7 C    Last Pain:  Vitals:   09/16/16 1944  TempSrc: Oral  PainSc:          Complications: No apparent anesthesia complications

## 2016-09-16 NOTE — ED Notes (Signed)
Contacted [facility] to tx patient to [facility]U; Dr. Nettie ElmMichael Ervin receiving

## 2016-09-16 NOTE — ED Notes (Signed)
[facility] arrived in room.

## 2016-09-16 NOTE — ED Notes (Signed)
Pt to US.

## 2016-09-16 NOTE — ED Triage Notes (Signed)
Pt. C/o heavy vaginal bleeding since her abortion in august. Pt. Is HIV + and hx of substance abuse. Pt. sts she has gone through 26 pads and is having severe abdominal pain. Pt. sts she is passing large clots and tissue. Pt. Called clinic where she had the abortion and they state this is normal. Pt. Undressed and hooked to monitor at this time.

## 2016-09-16 NOTE — MAU Note (Addendum)
[facility], patient had TAB on 08/14/16, stating she was 223w6d gestation, the beginning of the week started having cramping and mid week started having coffee colored vaginal bleeding. She called [facility] and they told her this was normal.  Last night started bleeding heavily with passing clots and something that was gray fleshy colored. Having a lot of cramping today.

## 2016-09-16 NOTE — Discharge Instructions (Signed)
Dilation and Curettage or Vacuum Curettage, Care After Refer to this sheet in the next few weeks. These instructions provide you with information on caring for yourself after your procedure. Your health care provider may also give you more specific instructions. Your treatment has been planned according to current medical practices, but problems sometimes occur. Call your health care provider if you have any problems or questions after your procedure. WHAT TO EXPECT AFTER THE PROCEDURE After your procedure, it is typical to have light cramping and bleeding. This may last for 2 days to 2 weeks after the procedure. HOME CARE INSTRUCTIONS   Do not drive for 24 hours.  Wait 1 week before returning to strenuous activities.  Take your temperature 2 times a day for 4 days and write it down. Provide these temperatures to your health care provider if you develop a fever.  Avoid long periods of standing.  Avoid heavy lifting, pushing, or pulling. Do not lift anything heavier than 10 pounds (4.5 kg).  Limit stair climbing to once or twice a day.  Take rest periods often.  You may resume your usual diet.  Drink enough fluids to keep your urine clear or pale yellow.  Your usual bowel function should return. If you have constipation, you may:  Take a mild laxative with permission from your health care provider.  Add fruit and bran to your diet.  Drink more fluids.  Take showers instead of baths until your health care provider gives you permission to take baths.  Do not go swimming or use a hot tub until your health care provider approves.  Try to have someone with you or available to you the first 24-48 hours, especially if you were given a general anesthetic.  Do not douche, use tampons, or have sex (intercourse) for 2 weeks after the procedure.  Only take over-the-counter or prescription medicines as directed by your health care provider. Do not take aspirin. It can cause  bleeding.  Follow up with your health care provider as directed. SEEK MEDICAL CARE IF:   You have increasing cramps or pain that is not relieved with medicine.  You have abdominal pain that does not seem to be related to the same area of earlier cramping and pain.  You have bad smelling vaginal discharge.  You have a rash.  You are having problems with any medicine. SEEK IMMEDIATE MEDICAL CARE IF:   You have bleeding that is heavier than a normal menstrual period.  You have a fever.  You have chest pain.  You have shortness of breath.  You feel dizzy or feel like fainting.  You pass out.  You have pain in your shoulder strap area.  You have heavy vaginal bleeding with or without blood clots. MAKE SURE YOU:   Understand these instructions.  Will watch your condition.  Will get help right away if you are not doing well or get worse.   This information is not intended to replace advice given to you by your health care provider. Make sure you discuss any questions you have with your health care provider.   Document Released: 12/08/2000 Document Revised: 12/16/2013 Document Reviewed: 07/10/2013 Elsevier Interactive Patient Education 2016 Elsevier Inc.  

## 2016-09-16 NOTE — ED Provider Notes (Signed)
[facility]-EMERGENCY DEPT Provider Note   CSN: [id number] Arrival date & time: 09/16/16  4098     History   Chief Complaint Chief Complaint  Patient presents with  . Vaginal Bleeding    HPI Samantha Fleming is a 35 y.o. female.  Patient with history of HIV presents with complaint of heavy vaginal bleeding for the past 5 days. Patient had an elective abortion 4 weeks ago. Patient was in her normal state of health after the procedure. Vaginal bleedinghas been very heavy over the past 4 days. Patient states that she has used 26 pads over 4 days and has been passing clots. She denies any lightheadedness or syncope. No shortness of breath. Patient complains of lower abdominal pain with cramping with radiation to her back. No treatments prior to arrival. She noted gray tissue discharge this morning. No h/o abdominal surgeries. The onset of this condition was acute. The course is constant. Aggravating factors: none. Alleviating factors: none.        Past Medical History:  Diagnosis Date  . Anxiety   . Depression   . Fibromyalgia 04/29/2015  . GAD (generalized anxiety disorder) 04/29/2015  . History of hepatitis C 04/29/2015  . 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 History of intravenous drug use in remission   . HIV disease (HCC) 04/29/2015  . Insomnia 09/15/2015  . Irregular menses 04/29/2015  . Left leg pain 09/15/2015  . Neuromuscular disorder (HCC)   . Pain of left calf 10/06/2015  . PTSD (post-traumatic stress disorder) 04/29/2015  . Reduced libido 09/15/2015  . Smoker 04/29/2015  . Tremor 10/06/2015  . Vaginitis and vulvovaginitis 04/29/2015  . Yeast infection 09/15/2015    Patient Active Problem List   Diagnosis Date Noted  . Tremor 10/06/2015  . Pain of left calf 10/06/2015  . Left leg pain 09/15/2015  . Yeast infection 09/15/2015  . Reduced libido 09/15/2015  . Insomnia 09/15/2015  . 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 Hepatitis C antibody test  positive 04/29/2015  . HIV disease (HCC) 04/29/2015  . PTSD (post-traumatic stress disorder) 04/29/2015  . Vaginitis and vulvovaginitis 04/29/2015  . Irregular menses 04/29/2015  . GAD (generalized anxiety disorder) 04/29/2015  . 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 Smoker 04/29/2015  . Fibromyalgia 04/29/2015  . Breast pain, left 03/31/2015    History reviewed. No pertinent surgical history.  OB History    Gravida Para Term Preterm AB Living   7 1     4      SAB TAB Ectopic Multiple Live Births     2             Home Medications    Prior to Admission medications   Medication Sig Start Date End Date Taking? Authorizing Provider  amoxicillin (AMOXIL) 500 MG capsule Take 1 capsule (500 mg total) by mouth 3 (three) times daily. FOR 10 DAYS 06/23/16   Audry Pili, PA-C  buPROPion [facility] SR) 150 MG 12 hr tablet TAKE 1 TABLET(150 MG) BY MOUTH TWICE DAILY 06/29/16   Ginnie Smart, MD  DESCOVY 200-25 MG tablet Take 1 tablet by mouth daily. 05/01/16   Randall Hiss, MD  HYDROcodone-acetaminophen (NORCO/VICODIN) 5-325 MG tablet Take 1 tablet by mouth every 4 (four) hours as needed. 06/23/16   Audry Pili, PA-C  ibuprofen (ADVIL,MOTRIN) 200 MG tablet Take 800 mg by mouth every 6 (six) hours as needed (pain).    Historical Provider, MD  TIVICAY 50  MG tablet TAKE 1 TABLET(50 MG) BY MOUTH DAILY 05/01/16   Ginnie Smart, MD  traZODone (DESYREL) 50 MG tablet TAKE 1 TABLET(50 MG) BY MOUTH AT BEDTIME AS NEEDED FOR SLEEP 04/28/16   Randall Hiss, MD    Family History Family History  Problem Relation Age of Onset  . Fibromyalgia Mother   . Sudden death Maternal Grandfather     Social History Social History  Substance Use Topics  . Smoking status: Current Every Day Smoker    Packs/day: 1.00    Years: 16.00    Types: Cigarettes  . Smokeless tobacco: Never Used  . Alcohol use No     Allergies   Asa [aspirin] and Rilpivirine   Review of  Systems Review of Systems  Constitutional: Negative for fever.  HENT: Negative for rhinorrhea and sore throat.   Eyes: Negative for redness.  Respiratory: Negative for cough.   Cardiovascular: Negative for chest pain.  Gastrointestinal: Positive for abdominal pain. Negative for diarrhea, nausea and vomiting.  Genitourinary: Positive for vaginal bleeding and vaginal discharge. Negative for dysuria and hematuria.  Musculoskeletal: Negative for myalgias.  Skin: Negative for rash.  Neurological: Negative for headaches.     Physical Exam Updated Vital Signs BP 117/74 (BP Location: Right Arm)   Pulse 87   Temp 98.4 F (36.9 C) (Oral)   Resp 21   SpO2 100%   Physical Exam  Constitutional: She appears well-developed and well-nourished.  HENT:  Head: Normocephalic and atraumatic.  Eyes: Conjunctivae are normal. Right eye exhibits no discharge. Left eye exhibits no discharge.  Neck: Normal range of motion. Neck supple.  Cardiovascular: Normal rate, regular rhythm and normal heart sounds.   Pulmonary/Chest: Effort normal and breath sounds normal.  Abdominal: Soft. She exhibits no mass. There is tenderness in the right lower quadrant, suprapubic area and left lower quadrant. There is no rebound and no guarding.  Genitourinary: Pelvic exam was performed with patient supine. Uterus is enlarged and tender. Cervix exhibits discharge (blood). There is tenderness and bleeding in the vagina.  Genitourinary Comments: Patient with moderate amount of blood and clots noted from the cervix. Os is open slightly, approximately fingertip.   Neurological: She is alert.  Skin: Skin is warm and dry.  Psychiatric: She has a normal mood and affect.  Nursing note and vitals reviewed.    ED Treatments / Results  Labs (all labs ordered are listed, but only abnormal results are displayed) Labs Reviewed  WET PREP, GENITAL - Abnormal; Notable for the following:       Result Value   WBC, Wet Prep HPF POC  MODERATE (*)    All other components within normal limits  CBC - Abnormal; Notable for the following:    RBC 3.69 (*)    HCT 35.6 (*)    All other components within normal limits  I-STAT BETA HCG BLOOD, ED ([facility], [facility], [facility] ONLY) - Abnormal; Notable for the following:    I-stat hCG, quantitative 1,790.7 (*)    All other components within normal limits  BASIC METABOLIC PANEL  ABO/RH  GC/CHLAMYDIA PROBE AMP ([place]) NOT AT [facility]    EKG  EKG Interpretation None       Radiology US Ob Comp Less 14 Wks  Result Date: 09/16/2016 CLINICAL DATA:  Vaginal bleeding for 4 days. Elective abortion performed on 08/18/2016. EXAM: OBSTETRIC <14 WK ULTRASOUND TECHNIQUE: Transabdominal ultrasound was performed for evaluation of the gestation as well as the maternal uterus and adnexal regions.  COMPARISON:  None. FINDINGS: Intrauterine gestational sac: No Yolk sac:  No Embryo:  No Maternal uterus/adnexae: Normal size uterus. Uterus is retroverted. Endometrium is heterogeneous and thickened measuring 2.7 cm. Some vascular flow is seen on color Doppler analysis within the endometrium. Some of this material may reflect hemorrhage or retained products of conception within the endometrial canal. Normal ovaries.  No adnexal masses.  Trace pelvic free fluid. IMPRESSION: 1. No evidence of an intrauterine or ectopic pregnancy. 2. Endometrial stripe is thickened to 2.7 cm and heterogeneous. Some of this material is likely within the endometrial canal, which may reflect products of hemorrhage. Products of conception should be considered since there is some vascular flow within this material. 3. Normal ovaries and adnexa. Electronically Signed   By: Amie Portlandavid  Ormond M.D.   On: 09/16/2016 14:09   [place] Ob Transvaginal  Result Date: 09/16/2016 CLINICAL DATA:  Vaginal bleeding for 4 days. Elective abortion performed on 08/18/2016. EXAM: OBSTETRIC <14 WK ULTRASOUND TECHNIQUE: Transabdominal ultrasound was performed for evaluation of  the gestation as well as the maternal uterus and adnexal regions. COMPARISON:  None. FINDINGS: Intrauterine gestational sac: No Yolk sac:  No Embryo:  No Maternal uterus/adnexae: Normal size uterus. Uterus is retroverted. Endometrium is heterogeneous and thickened measuring 2.7 cm. Some vascular flow is seen on color Doppler analysis within the endometrium. Some of this material may reflect hemorrhage or retained products of conception within the endometrial canal. Normal ovaries.  No adnexal masses.  Trace pelvic free fluid. IMPRESSION: 1. No evidence of an intrauterine or ectopic pregnancy. 2. Endometrial stripe is thickened to 2.7 cm and heterogeneous. Some of this material is likely within the endometrial canal, which may reflect products of hemorrhage. Products of conception should be considered since there is some vascular flow within this material. 3. Normal ovaries and adnexa. Electronically Signed   By: Amie Portlandavid  Ormond M.D.   On: 09/16/2016 14:09    Procedures Procedures (including critical care time)  Medications Ordered in ED Medications  HYDROmorphone (DILAUDID) injection 1 mg (1 mg Intravenous Given 09/16/16 1041)  ondansetron (ZOFRAN) injection 4 mg (4 mg Intravenous Given 09/16/16 1041)  sodium chloride 0.9 % bolus 1,000 mL (0 mLs Intravenous Stopped 09/16/16 1316)  ketorolac (TORADOL) 30 MG/ML injection 30 mg (30 mg Intravenous Given 09/16/16 1316)  sodium chloride 0.9 % bolus 1,000 mL (1,000 mLs Intravenous New Bag/Given 09/16/16 1517)  fentaNYL (SUBLIMAZE) injection 25 mcg (25 mcg Intravenous Given 09/16/16 1517)     Initial Impression / Assessment and Plan / ED Course  I have reviewed the triage vital signs and the nursing notes.  Pertinent labs & imaging results that were available during my care of the patient were reviewed by me and considered in my medical decision making (see chart for details).  Clinical Course   Patient seen and examined. Work-up initiated. Medications  ordered.   Vital signs reviewed and are as follows: BP 94/69   Pulse 72   Temp 98.4 F (36.9 C) (Oral)   Resp 16   SpO2 97%   Pelvic exam performed with RN chaperone.   Findings as above. Patient continues to have low BP but is asymptomatic. Spoke with Dr. [name] who will evaluate patient in the MAU. [facility]   BP 107/78   Pulse 69   Temp 98.4 F (36.9 C) (Oral)   Resp 16   SpO2 99%    Final Clinical Impressions(s) / ED Diagnoses   Final diagnoses:  Incomplete miscarriage  Transfer to MAU for further eval.    New Prescriptions New Prescriptions   No medications on file     Renne Crigler, PA-C 09/16/16 1545    Courteney Lyn Mackuen, MD 09/17/16 1422

## 2016-09-16 NOTE — Op Note (Signed)
Samantha RosenthalBrittany Fleming PROCEDURE DATE: 09/16/2016  PREOPERATIVE DIAGNOSIS: Retained Products of Conception POSTOPERATIVE DIAGNOSIS: The same PROCEDURE:     Dilation and Evacuation SURGEON:  Dr. Casimiro NeedleMichael L. Langston [place]  INDICATIONS: 35 y.o. [id number] with retained POC, needing surgical completion.  Risks of surgery were discussed with the patient including but not limited to: bleeding which may require transfusion; infection which may require antibiotics; injury to uterus or surrounding organs; need for additional procedures including laparotomy or laparoscopy; possibility of intrauterine scarring which may impair future fertility; and other postoperative/anesthesia complications. Written informed consent was obtained.    FINDINGS:  A 8 week size uterus, moderate amounts of products of conception, specimen sent to pathology.  ANESTHESIA:    Monitored intravenous sedation, paracervical block. INTRAVENOUS FLUIDS:  As recorded ESTIMATED BLOOD LOSS:  20 cc SPECIMENS:  Products of conception sent to pathology COMPLICATIONS:  None immediate.  PROCEDURE DETAILS:  The patient received intravenous Doxycycline while in the preoperative area.  She was then taken to the operating room where monitored intravenous sedation was administered and was found to be adequate.  After an adequate timeout was performed, she was placed in the dorsal lithotomy position and examined; then prepped and draped in the sterile manner.   Her bladder was catheterized for an unmeasured amount of clear, yellow urine. A vaginal speculum was then placed in the patient's vagina and a single tooth tenaculum was applied to the anterior lip of the cervix.  A paracervical block using 30 ml of 0.25% Marcaine was administered. The cervix was gently dilated to accommodate a 8 mm suction curette that was gently advanced to the uterine fundus.  The suction device was then activated and curette slowly rotated to clear the uterus of products of conception.  A sharp  curettage was then performed to confirm complete emptying of the uterus. There was minimal bleeding noted and the tenaculum removed with good hemostasis noted.   All instruments were removed from the patient's vagina.  Sponge and instrument counts were correct times two  The patient tolerated the procedure well and was taken to the recovery area awake, and in stable condition.  The patient will be discharged to home as per PACU criteria.  Routine postoperative instructions given.  She was prescribed Percocet &, Ibuprofen.  She will follow up in the clinic in 3-4 weeks for postoperative evaluation.   Velmer Broadfoot L. [name], MD, FACOG Attending Obstetrician & Gynecologist Faculty Practice, [facility]

## 2016-09-16 NOTE — H&P (Signed)
Subjective:    Patient is a 35 y.o. female scheduled for a D & E. She reports having a D & C at [facility] August [facility]. She states she was 7 6/7 weeks. Did well post procedure until this past Monday when she started having cramps. Heavy Bleeding last night this morning. [facility]. W/U revealed BHCG at 1790 and thicken endometrium suggestive of retained products of conception. [facility] and [facility]  She has a H/O drug abuse, HIV ( undectable levels per pt) anxiety and PTSD.   Pertinent Gynecological History:  Menses: June 2017 Contraception: none DES exposure: denies Sexually transmitted diseases: past history:   Discussed Blood/Blood Products: no  Menstrual History: OB History    Gravida Para Term Preterm AB Living   4 0     3 1   SAB TAB Ectopic Multiple Live Births   1 2     1       Menarche age: [age] No LMP recorded.    Review of Systems Pertinent items are noted in HPI.    Objective:    BP 115/64 (BP Location: Left Arm)   Pulse 62   Temp 98.2 F (36.8 C) (Oral)   Resp 16   SpO2 99%   General:   alert and cooperative  Skin:   normal  HEENT:  neck supple with midline trachea  Lungs:   clear to auscultation bilaterally  Heart:   regular rate and rhythm, S1, S2 normal, no murmur, click, rub or gallop  Breasts:   deferred  Abdomen:  soft, non-tender; bowel sounds normal; no masses,  no organomegaly  Pelvis:  Exam deferred.to OR       FHT:   Uterine Size:   Presentation:   Cervix:             Dilation:       Effacement:             Station:      Consistency:            Position:      Assessment:   Retained Products of contraception     Plan:  To OR for D & E  Counseling: Procedure, risks, reasons, benefits and complications (including injury to bowel, bladder, major blood vessel, ureter, bleeding, possibility of transfusion, infection, or fistula formation) reviewed in detail. Consent signed.  Will proceed when OR ready

## 2016-09-17 LAB — ABO/RH: ABO/RH(D): B POS

## 2016-09-18 ENCOUNTER — Encounter (HOSPITAL_COMMUNITY): Payer: Self-pay | Admitting: Obstetrics and Gynecology

## 2016-09-18 LAB — GC/CHLAMYDIA PROBE AMP (~~LOC~~) NOT AT ARMC
Chlamydia: NEGATIVE
NEISSERIA GONORRHEA: NEGATIVE

## 2016-10-05 ENCOUNTER — Encounter: Payer: Self-pay | Admitting: Obstetrics and Gynecology

## 2016-10-05 ENCOUNTER — Ambulatory Visit (INDEPENDENT_AMBULATORY_CARE_PROVIDER_SITE_OTHER): Payer: Self-pay | Admitting: Obstetrics and Gynecology

## 2016-10-05 DIAGNOSIS — Z9889 Other specified postprocedural states: Secondary | ICD-10-CM | POA: Insufficient documentation

## 2016-10-05 NOTE — Progress Notes (Signed)
Post op Visit  Pt here for post op visit from D & E for retained POC. Pt doing well. No complaints. Bleeding has stopped. No bowel or bladder dysfunction.  Pathology reviewed with pt.  PE AF VSS Lungs clear Heart RRR  A/P Post op visit Return to normal activities. Discussed contraception. Undecided at present, considering vasectomy. Condoms for now. Follow up PRN.

## 2016-10-10 ENCOUNTER — Encounter (HOSPITAL_COMMUNITY): Payer: Self-pay | Admitting: Obstetrics and Gynecology

## 2016-10-13 ENCOUNTER — Other Ambulatory Visit: Payer: Self-pay | Admitting: Infectious Disease

## 2016-10-13 ENCOUNTER — Other Ambulatory Visit: Payer: Self-pay | Admitting: Infectious Diseases

## 2016-10-13 DIAGNOSIS — B20 Human immunodeficiency virus [HIV] disease: Secondary | ICD-10-CM

## 2016-10-13 DIAGNOSIS — G4700 Insomnia, unspecified: Secondary | ICD-10-CM

## 2016-10-13 NOTE — Telephone Encounter (Signed)
Made MD appt for 11/21/16.

## 2016-10-16 ENCOUNTER — Ambulatory Visit: Payer: Self-pay | Admitting: Obstetrics and Gynecology

## 2016-10-18 ENCOUNTER — Telehealth: Payer: Self-pay | Admitting: *Deleted

## 2016-10-18 NOTE — Telephone Encounter (Signed)
Concerned about limited # of Trazadone tablets, #15.  RN advised the patient to discuss this at her next OV, the end of November.  Patient would appreciate a call from Dr. Ninetta LightsHatcher to discuss this medication.

## 2016-10-25 ENCOUNTER — Telehealth: Payer: Self-pay | Admitting: Infectious Diseases

## 2016-10-25 ENCOUNTER — Other Ambulatory Visit: Payer: Self-pay | Admitting: Infectious Diseases

## 2016-10-25 DIAGNOSIS — G4700 Insomnia, unspecified: Secondary | ICD-10-CM

## 2016-10-25 MED ORDER — TRAZODONE HCL 50 MG PO TABS: 25.0000 mg | ORAL_TABLET | Freq: Every evening | ORAL | 3 refills | Status: DC | PRN

## 2016-10-25 MED ORDER — TRAZODONE HCL 50 MG PO TABS: 50.0000 mg | ORAL_TABLET | Freq: Every evening | ORAL | 3 refills | Status: DC | PRN

## 2016-10-25 NOTE — Telephone Encounter (Signed)
Called pt and clarified her trazadone rx.

## 2016-10-25 NOTE — Telephone Encounter (Signed)
Called pt, corrected her rx to 30 days with 3 refills.

## 2016-11-10 ENCOUNTER — Other Ambulatory Visit: Payer: Self-pay | Admitting: Infectious Diseases

## 2016-11-10 DIAGNOSIS — F32A Depression, unspecified: Secondary | ICD-10-CM

## 2016-11-10 DIAGNOSIS — F329 Major depressive disorder, single episode, unspecified: Secondary | ICD-10-CM

## 2016-11-21 ENCOUNTER — Ambulatory Visit (INDEPENDENT_AMBULATORY_CARE_PROVIDER_SITE_OTHER): Payer: Self-pay | Admitting: Infectious Diseases

## 2016-11-21 ENCOUNTER — Encounter: Payer: Self-pay | Admitting: Infectious Diseases

## 2016-11-21 ENCOUNTER — Other Ambulatory Visit: Payer: Self-pay | Admitting: Infectious Diseases

## 2016-11-21 VITALS — BP 110/75 | HR 80 | Temp 97.6°F | Ht 65.0 in | Wt 138.0 lb

## 2016-11-21 DIAGNOSIS — Z21 Asymptomatic human immunodeficiency virus [HIV] infection status: Secondary | ICD-10-CM

## 2016-11-21 DIAGNOSIS — J0110 Acute frontal sinusitis, unspecified: Secondary | ICD-10-CM

## 2016-11-21 DIAGNOSIS — N926 Irregular menstruation, unspecified: Secondary | ICD-10-CM

## 2016-11-21 DIAGNOSIS — Z113 Encounter for screening for infections with a predominantly sexual mode of transmission: Secondary | ICD-10-CM

## 2016-11-21 DIAGNOSIS — B20 Human immunodeficiency virus [HIV] disease: Secondary | ICD-10-CM

## 2016-11-21 LAB — CBC
HEMATOCRIT: 37.7 % (ref 35.0–45.0)
HEMOGLOBIN: 12.6 g/dL (ref 11.7–15.5)
MCH: 32.3 pg (ref 27.0–33.0)
MCHC: 33.4 g/dL (ref 32.0–36.0)
MCV: 96.7 fL (ref 80.0–100.0)
MPV: 10.7 fL (ref 7.5–12.5)
Platelets: 180 10*3/uL (ref 140–400)
RBC: 3.9 MIL/uL (ref 3.80–5.10)
RDW: 12.5 % (ref 11.0–15.0)
WBC: 3.8 10*3/uL (ref 3.8–10.8)

## 2016-11-21 LAB — COMPREHENSIVE METABOLIC PANEL
ALBUMIN: 4.3 g/dL (ref 3.6–5.1)
ALK PHOS: 31 U/L — AB (ref 33–115)
ALT: 9 U/L (ref 6–29)
AST: 12 U/L (ref 10–30)
BILIRUBIN TOTAL: 0.3 mg/dL (ref 0.2–1.2)
BUN: 17 mg/dL (ref 7–25)
CALCIUM: 9.1 mg/dL (ref 8.6–10.2)
CO2: 26 mmol/L (ref 20–31)
CREATININE: 0.88 mg/dL (ref 0.50–1.10)
Chloride: 105 mmol/L (ref 98–110)
Glucose, Bld: 81 mg/dL (ref 65–99)
Potassium: 4.1 mmol/L (ref 3.5–5.3)
SODIUM: 138 mmol/L (ref 135–146)
TOTAL PROTEIN: 6.4 g/dL (ref 6.1–8.1)

## 2016-11-21 NOTE — Assessment & Plan Note (Signed)
She will f/u with GYN Check h/h today.

## 2016-11-21 NOTE — Assessment & Plan Note (Addendum)
Will check her labs today Offered/refused condoms.  Offer husband to see CPP for prep.  Refuses flu and menig.  Will see her back in 6 months.

## 2016-11-21 NOTE — Assessment & Plan Note (Addendum)
Advised to try flonase for 7 days max.  Allegra.

## 2016-11-21 NOTE — Progress Notes (Signed)
   Subjective:    Patient ID: Samantha Fleming, female    DOB: 1981/02/13, 35 y.o.   MRN: [id number]  HPI 35 yo F with hx of HIV+, Hep C AB+ (Vl -), anxiety, PTSD, fibromyalgia, neuropahty. Was prev changed to tivicay/descovy. Was in hospital 09-16-16 with incomplete AB, required d & e. Her periods have been heavy and irregular since.  Feels poorly- for last 2 days- runny nose, diarrhea, headaches. Has taken theraflu, ibuprofen. Vit C. No f/c.   Has been more fatigued for past month and half. Sleeping more.  Easy bruising.  ART has been going well, no sfx.   HIV 1 RNA Quant (copies/mL)  Date Value  06/29/2016 <20  01/17/2016 <20  09/15/2015 <20   CD4 T Cell Abs (/uL)  Date Value  06/29/2016 460  01/17/2016 640  09/15/2015 530   Husband (-), tested q6 months. He is not sure if insurance would cover prep. Does not want on his paperwork, he is truck driver.   Review of Systems  Constitutional: Negative for appetite change, chills, fever and unexpected weight change.  HENT: Positive for congestion.   Respiratory: Negative for cough and shortness of breath.   Gastrointestinal: Positive for diarrhea. Negative for constipation.  Genitourinary: Negative for difficulty urinating.  Hematological: Bruises/bleeds easily (easy bruising, no gum/nose bleeds. ).       Objective:   Physical Exam  Constitutional: She appears well-developed and well-nourished.  HENT:  Mouth/Throat: No oropharyngeal exudate.  Eyes: EOM are normal. Pupils are equal, round, and reactive to light.  Neck: Neck supple.  Cardiovascular: Normal rate, regular rhythm and normal heart sounds.   Pulmonary/Chest: Effort normal and breath sounds normal.  Abdominal: Soft. Bowel sounds are normal. There is no tenderness. There is no rebound.  Musculoskeletal: She exhibits no edema.  Lymphadenopathy:    She has no cervical adenopathy.          Assessment & Plan:

## 2016-11-21 NOTE — Addendum Note (Signed)
Addended by: Mariea ClontsGREEN, Detrell Umscheid D on: 11/21/2016 04:03 PM   Modules accepted: Orders

## 2016-11-22 LAB — URINE CYTOLOGY ANCILLARY ONLY
Chlamydia: NEGATIVE
NEISSERIA GONORRHEA: NEGATIVE

## 2016-11-22 LAB — T-HELPER CELL (CD4) - (RCID CLINIC ONLY)
CD4 T CELL ABS: 560 /uL (ref 400–2700)
CD4 T CELL HELPER: 40 % (ref 33–55)

## 2016-11-22 LAB — RPR

## 2016-11-23 LAB — HIV-1 RNA QUANT-NO REFLEX-BLD

## 2016-12-10 ENCOUNTER — Other Ambulatory Visit: Payer: Self-pay | Admitting: Infectious Disease

## 2016-12-10 ENCOUNTER — Other Ambulatory Visit: Payer: Self-pay | Admitting: Infectious Diseases

## 2016-12-10 DIAGNOSIS — B20 Human immunodeficiency virus [HIV] disease: Secondary | ICD-10-CM

## 2017-01-03 IMAGING — US US OB COMP LESS 14 WK
1 series · 14 of 28 positions shown · non-contrast
Comparison: None.

CLINICAL DATA: Vaginal bleeding for 4 days. Elective abortion
performed on 08/18/2016.

EXAM:
OBSTETRIC <14 WK ULTRASOUND
TECHNIQUE: Transabdominal ultrasound was performed for evaluation of the
gestation as well as the maternal uterus and adnexal regions.

[Series 1: us ob comp less 14 wk · 0.25mm/px · 52 acquisitions, 14 frames shown]
[im 2/52]
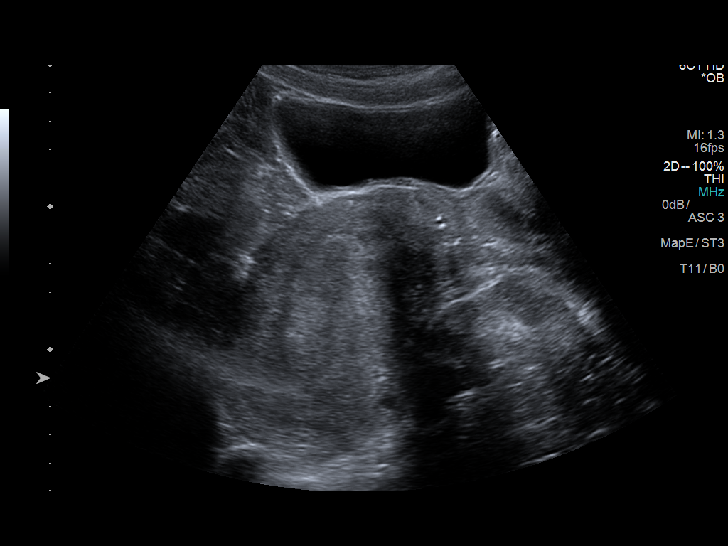
[im 6/52]
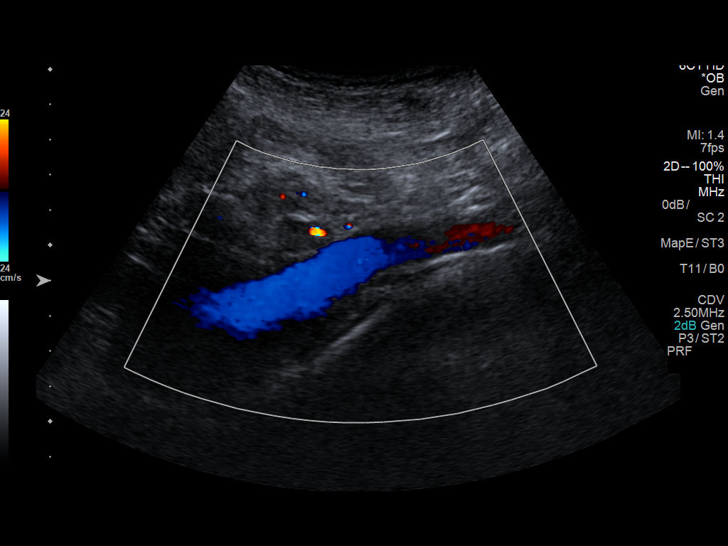
[im 10/52]
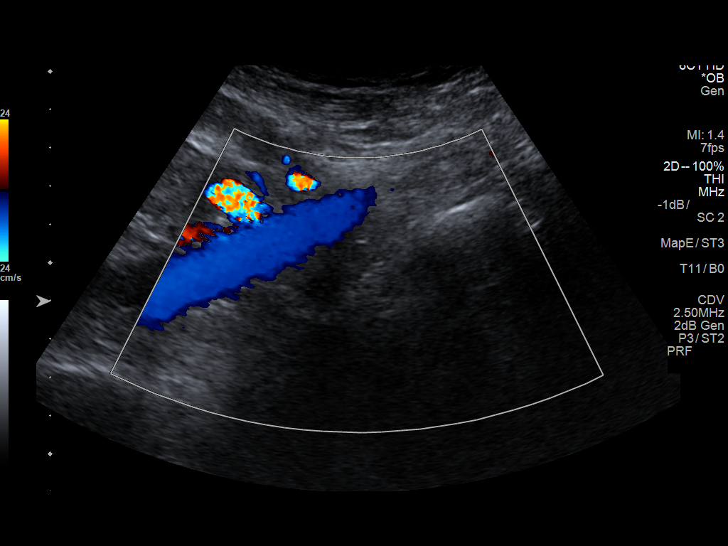
[im 14/52]
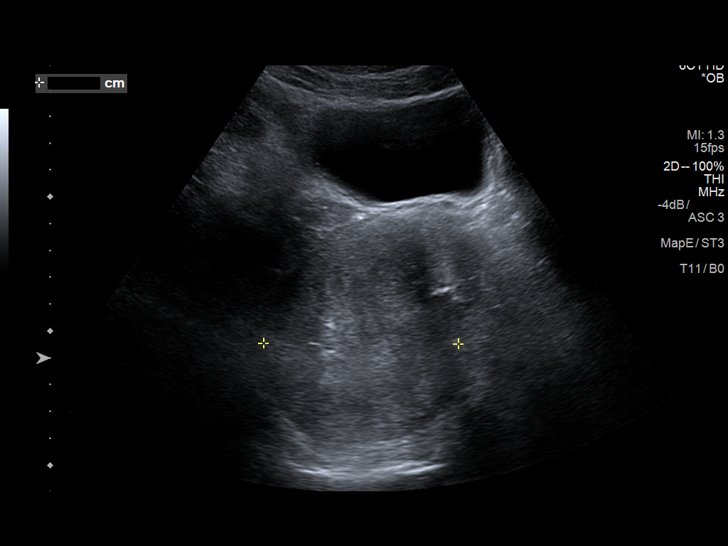
[im 18/52]
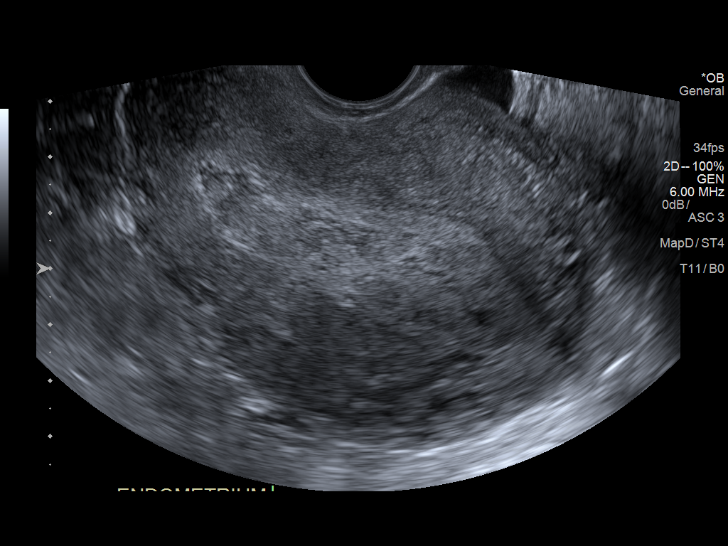
[im 21/52]
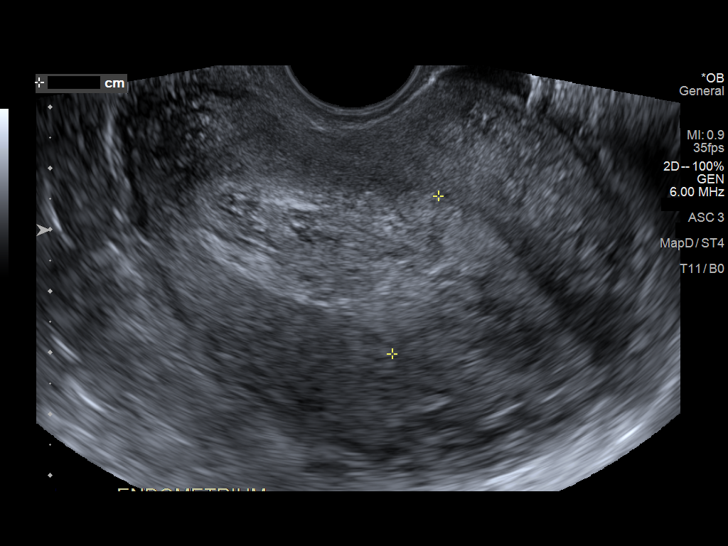
[im 25/52]
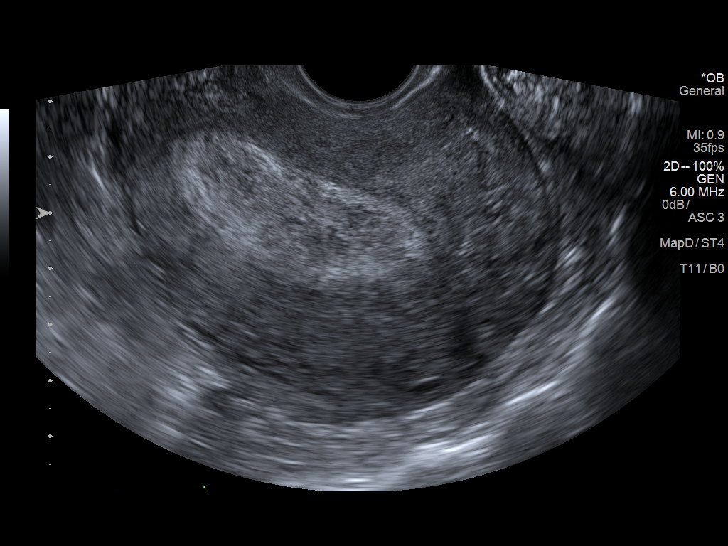
[im 29/52]
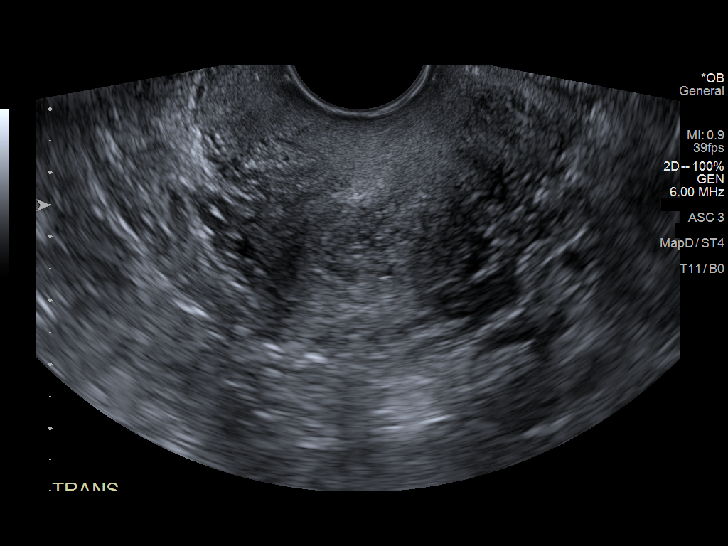
[im 33/52]
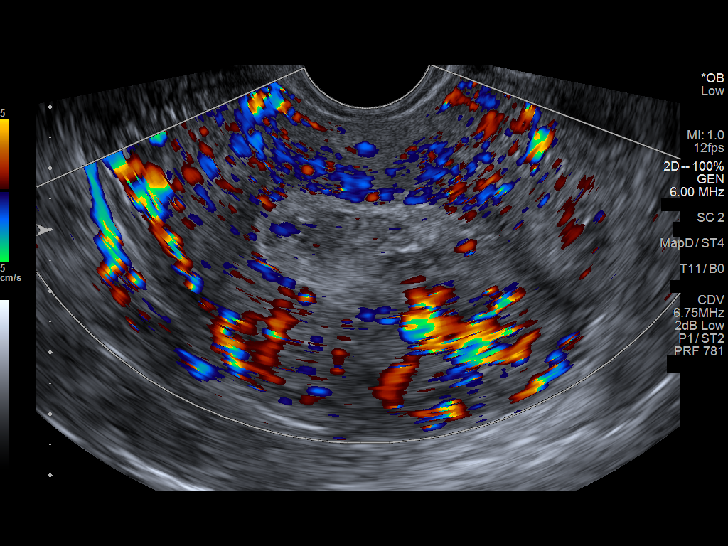
[im 36/52]
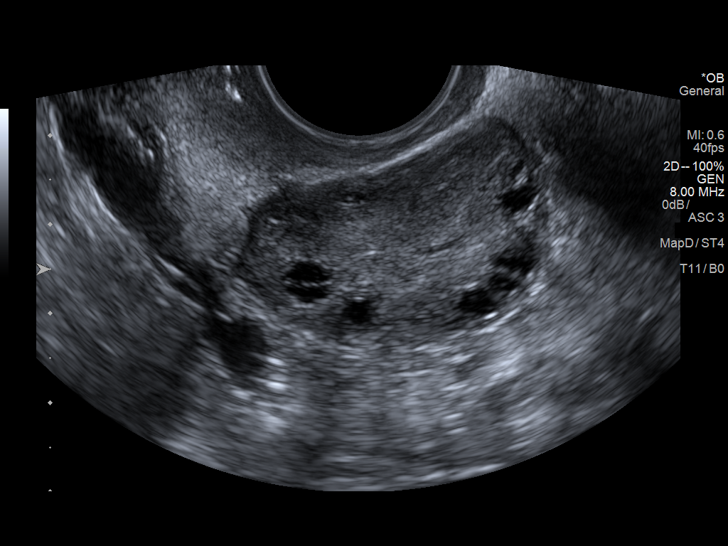
[im 40/52]
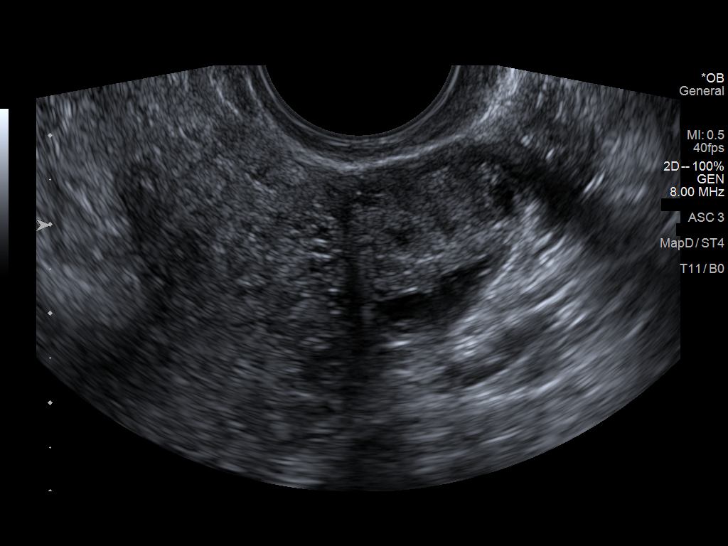
[im 44/52]
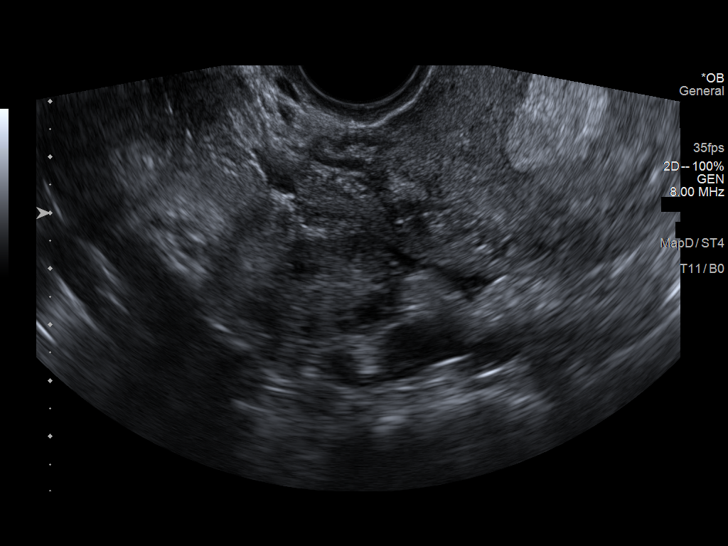
[im 48/52]
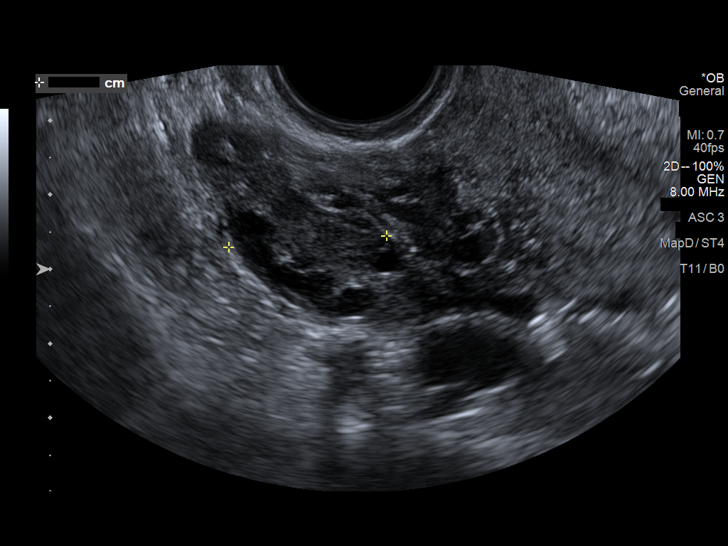
[im 52/52]
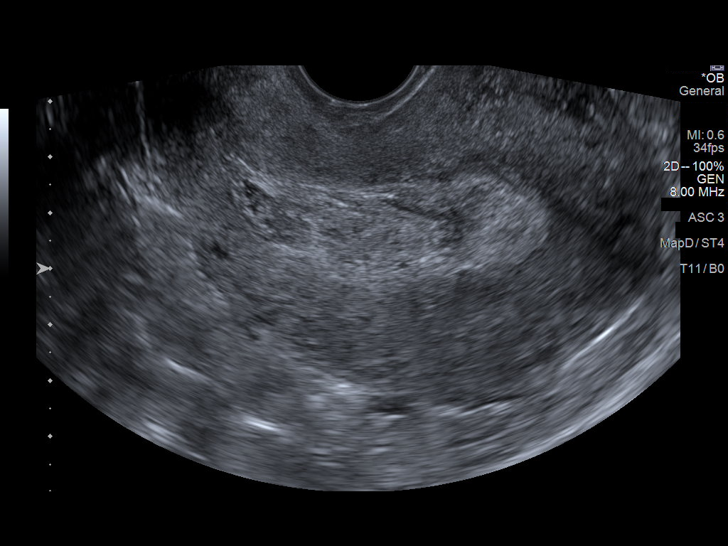

[14 of 28 positions shown; findings below may reference images not displayed]

FINDINGS: Intrauterine gestational sac: No

Yolk sac:  No

Embryo:  No

Maternal uterus/adnexae: Normal size uterus. Uterus is retroverted.
Endometrium is heterogeneous and thickened measuring 2.7 cm. Some
vascular flow is seen on color Doppler analysis within the
endometrium. Some of this material may reflect hemorrhage or
retained products of conception within the endometrial canal.

Normal ovaries.  No adnexal masses.  Trace pelvic free fluid.
IMPRESSION: 1. No evidence of an intrauterine or ectopic pregnancy.
2. Endometrial stripe is thickened to 2.7 cm and heterogeneous. Some
of this material is likely within the endometrial canal, which may
reflect products of hemorrhage. Products of conception should be
considered since there is some vascular flow within this material.
3. Normal ovaries and adnexa.

## 2017-01-23 ENCOUNTER — Emergency Department (HOSPITAL_COMMUNITY)
Admission: EM | Admit: 2017-01-23 | Discharge: 2017-01-23 | Disposition: A | Discharge status: Home / Self Care | Payer: Self-pay | Attending: Emergency Medicine | Admitting: Emergency Medicine

## 2017-01-23 DIAGNOSIS — Y9289 Other specified places as the place of occurrence of the external cause: Secondary | ICD-10-CM | POA: Insufficient documentation

## 2017-01-23 DIAGNOSIS — Y9389 Activity, other specified: Secondary | ICD-10-CM | POA: Insufficient documentation

## 2017-01-23 DIAGNOSIS — M542 Cervicalgia: Secondary | ICD-10-CM

## 2017-01-23 DIAGNOSIS — X509XXA Other and unspecified overexertion or strenuous movements or postures, initial encounter: Secondary | ICD-10-CM | POA: Insufficient documentation

## 2017-01-23 DIAGNOSIS — Y999 Unspecified external cause status: Secondary | ICD-10-CM | POA: Insufficient documentation

## 2017-01-23 DIAGNOSIS — R519 Headache, unspecified: Secondary | ICD-10-CM

## 2017-01-23 DIAGNOSIS — R51 Headache: Secondary | ICD-10-CM | POA: Insufficient documentation

## 2017-01-23 DIAGNOSIS — F17210 Nicotine dependence, cigarettes, uncomplicated: Secondary | ICD-10-CM | POA: Insufficient documentation

## 2017-01-23 MED ORDER — CYCLOBENZAPRINE HCL 10 MG PO TABS: 10.0000 mg | ORAL_TABLET | Freq: Two times a day (BID) | ORAL | 0 refills | Status: DC | PRN

## 2017-01-23 NOTE — ED Notes (Signed)
Patient verbalized understanding of discharge instructions and denies any further needs or questions at this time. VS stable. Patient ambulatory with steady gait.  

## 2017-01-23 NOTE — ED Triage Notes (Signed)
Pt states that she reached for phone 22 nights ago while in bed and felt a pop in her rt shoulder now pain is worse and it makes her vomite, pain rads to neck rt side and she has a h/a

## 2017-01-23 NOTE — Discharge Instructions (Signed)
Please take Flexeril twice a day as needed for pain. Take ibuprofen or naproxen as needed for pain. Use ice and warm compress to the area. Make sure to stretch and massage area every day.   Contact a health care provider if: Your pain is getting worse. Your pain is not relieved with medicines. You lose function in the area of the pain if the pain is in your arms, legs, or neck.. Your headache becomes severe. You have repeated vomiting. You have a stiff neck. You have a loss of vision. You have problems with speech. You have pain in the eye or ear. You have muscular weakness or loss of muscle control. You lose your balance or have trouble walking. You feel faint or pass out. You have confusion.

## 2017-01-23 NOTE — ED Notes (Addendum)
Pt reports R sided neck pain that shoots up to posterior and R side of head and down into R shoulder x 2 days. States she was reaching for the phone while in bed and felt a pop in her neck. C/o headache, nausea, and stiffness of neck as well as pain when trying move R arm. States she also has vomited twice d/t the pain.

## 2017-01-23 NOTE — ED Provider Notes (Signed)
[facility]ENCY DEPT Provider Note   CSN: [id number] Arrival date & time: 01/23/17  1432     History   Chief Complaint Chief Complaint  Patient presents with  . Neck Pain  . Headache  . Emesis    HPI Samantha Fleming is a 36 y.o. female with history of hep C, HIV, IV drug use presents today complaining of right-sided neck pain that shoots up to the back and side of her head and also down towards right shoulder for 2 days. She describes the pain as gradually worsened yesterday and now sharp and constant 9/10. She complains of associated headache, nausea and neck stiffness when turning to the right and when moving right arm. She admits to associated vomiting twice due to her extreme pain. She states that she was reaching across her body reaching for her phone in bed and felt a "pop" in her neck and pain has gradually worsened since then. She has never had anything like this before. She has tried ibuprofen, heat/cold compress, electric stimulation to the area with little to no relief. She denies any fevers, chills, chest pain, shortness of breath, abdominal pain, changes in urine, changes in bowel movements. She states she is taking all her medications as prescribed. She states that her CD4 count is over 500 and is regularly seen by her HIV specialist. She denies any recent IV drug use.   The history is provided by the patient. No language interpreter was used.  Neck Pain   Associated symptoms include headaches. Pertinent negatives include no photophobia, no chest pain and no numbness.  Headache   Associated symptoms include nausea and vomiting. Pertinent negatives include no fever and no shortness of breath.  Emesis   Associated symptoms include headaches and myalgias. Pertinent negatives include no abdominal pain, no chills, no cough, no diarrhea and no fever.    Past Medical History:  Diagnosis Date  . Anxiety   . Depression   . Fibromyalgia 04/29/2015  . GAD (generalized anxiety  disorder) 04/29/2015  . History of hepatitis C 04/29/2015  . 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 History of intravenous drug use in remission   . HIV disease (HCC) 04/29/2015  . Insomnia 09/15/2015  . Irregular menses 04/29/2015  . Left leg pain 09/15/2015  . Neuromuscular disorder (HCC)   . Pain of left calf 10/06/2015  . PTSD (post-traumatic stress disorder) 04/29/2015  . Reduced libido 09/15/2015  . Smoker 04/29/2015  . Tremor 10/06/2015  . Vaginitis and vulvovaginitis 04/29/2015  . Yeast infection 09/15/2015    Patient Active Problem List   Diagnosis Date Noted  . Acute frontal sinusitis 11/21/2016  . Post-operative state 10/05/2016  . Tremor 10/06/2015  . Reduced libido 09/15/2015  . Insomnia 09/15/2015  . 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 Hepatitis C antibody test positive 04/29/2015  . HIV disease (HCC) 04/29/2015  . PTSD (post-traumatic stress disorder) 04/29/2015  . Irregular menses 04/29/2015  . GAD (generalized anxiety disorder) 04/29/2015  . 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 Smoker 04/29/2015  . Fibromyalgia 04/29/2015  . Breast pain, left 03/31/2015    Past Surgical History:  Procedure Laterality Date  . DILATION AND EVACUATION N/A 09/16/2016   Procedure: DILATATION AND EVACUATION;  Surgeon: Hermina StaggersMichael L Ervin, MD;  Location: [facility];  Service: Gynecology;  Laterality: N/A;    OB History    Gravida Para Term Preterm AB Living   4 0     3  1   SAB TAB Ectopic Multiple Live Births   1 2     1        Home Medications    Prior to Admission medications   Medication Sig Start Date End Date Taking? Authorizing Provider  buPROPion (WELLBUTRIN SR) 150 MG 12 hr tablet TAKE 1 TABLET(150 MG) BY MOUTH TWICE DAILY 11/10/16   Ginnie Smart, MD  cyclobenzaprine (FLEXERIL) 10 MG tablet Take 1 tablet (10 mg total) by mouth 2 (two) times daily as needed for muscle spasms. 01/23/17   Manases Etchison [address] [place], [place]    DESCOVY 200-25 MG tablet TAKE 1 TABLET BY MOUTH DAILY 12/11/16   Ginnie Smart, MD  ibuprofen (ADVIL,MOTRIN) 800 MG tablet Take 1 tablet (800 mg total) by mouth every 8 (eight) hours as needed. 09/16/16   Hermina Staggers, MD  TIVICAY 50 MG tablet TAKE 1 TABLET BY MOUTH DAILY 12/11/16   Ginnie Smart, MD  traZODone (DESYREL) 50 MG tablet Take 1 tablet (50 mg total) by mouth at bedtime as needed for sleep. 10/25/16 11/24/16  Ginnie Smart, MD    Family History Family History  Problem Relation Age of Onset  . Fibromyalgia Mother   . Sudden death Maternal Grandfather     Social History Social History  Substance Use Topics  . Smoking status: Current Every Day Smoker    Packs/day: 1.00    Years: 16.00    Types: Cigarettes  . Smokeless tobacco: Never Used  . Alcohol use 0.6 oz/week    1 Glasses of wine per week     Comment: occasional     Allergies   Asa [aspirin] and Rilpivirine   Review of Systems Review of Systems  Constitutional: Negative for chills and fever.  Eyes: Negative for photophobia.  Respiratory: Negative for cough and shortness of breath.   Cardiovascular: Negative for chest pain.  Gastrointestinal: Positive for nausea and vomiting. Negative for abdominal pain and diarrhea.  Genitourinary: Negative for difficulty urinating and dysuria.  Musculoskeletal: Positive for myalgias, neck pain (Right sided) and neck stiffness (Right sided. ).  Skin: Negative for rash and wound.  Neurological: Positive for headaches. Negative for numbness.     Physical Exam Updated Vital Signs BP 116/84 (BP Location: Left Arm)   Pulse 82   Temp 98.5 F (36.9 C) (Oral)   Resp 16   SpO2 98%   Physical Exam  Constitutional: She is oriented to person, place, and time. She appears well-developed and well-nourished.  Well appearing.   HENT:  Head: Normocephalic and atraumatic.  Mouth/Throat: Oropharynx is clear and moist.  Eyes: EOM are normal. Pupils are equal, round,  and reactive to light.  Neck: No tracheal deviation present.  Neck has limited range of motion to the right side secondary to pain and discomfort. No difficulty turning to the left. Minimal tenderness to cervical spine: Maximal tenderness to right cervical paraspinal muscle and area.  Cardiovascular: Normal rate and normal heart sounds.   No murmur heard. No obvious murmur noted.  Pulmonary/Chest: Effort normal and breath sounds normal. No respiratory distress. She has no wheezes.  CTA. None recently respiratory distress. No wheezing or to extra sounds.  Abdominal: Soft. She exhibits no mass. There is no tenderness. There is no rebound.  Abdomen soft and nontender. No rebound or guarding.  Musculoskeletal:  Minimal midline tenderness at cervical spine. Tenderness noted at right cervical paraspinal muscles and area. Tenderness at right upper trapezius. Patient has good range of motion of  right shoulder. No swelling, redness, wound, deformity noted.  Neurological: She is alert and oriented to person, place, and time.  Cranial Nerves:  III,IV, VI: ptosis not present, extra-ocular movements intact bilaterally, direct and consensual pupillary light reflexes intact bilaterally V: facial sensation, jaw opening, and bite strength equal bilaterally VII: eyebrow raise, eyelid close, smile, frown, pucker equal bilaterally VIII: hearing grossly normal bilaterally  IX,X: palate elevation and swallowing intact XI: bilateral shoulder shrug and lateral head rotation equal and strong XII: midline tongue extension  Cerebellar tests negative.  Sensory intact.  Muscle strength 5/5 Patient able to ambulate without difficulty.   Negative Brudzinski sign. Negative Kernig sign.  Skin: Skin is warm.  Psychiatric: She has a normal mood and affect. Her behavior is normal.  Nursing note and vitals reviewed.    ED Treatments / Results  Labs (all labs ordered are listed, but only abnormal results are  displayed) Labs Reviewed - No data to display  EKG  EKG Interpretation None       Radiology No results found.  Procedures Procedures (including critical care time)  Medications Ordered in ED Medications - No data to display   Initial Impression / Assessment and Plan / ED Course  I have reviewed the triage vital signs and the nursing notes.  Pertinent labs & imaging results that were available during my care of the patient were reviewed by me and considered in my medical decision making (see chart for details).     Pain likely muscular, likely muscle spasm. No trauma or previous trauma to the area. No focal tenderness at midline cervical spine, thoracic spine, or lumbar spine. Low suspicion for fracture or dislocation at this time. Low suspicion for SAH, hematoma, meningitis, epidural abscess, or muscular abscess. I do not think x-ray is necessary at this time due to history of presentation. Patient given instructions to continue symptomatically treatment at home with cold and warm compresses, stretching, massage and, use of ibuprofen or naproxen, and use of Flexeril at home. Encouraged to PCP follow-up in 1-2 weeks as needed. Return precautions given.   Final Clinical Impressions(s) / ED Diagnoses   Final diagnoses:  Neck pain on right side  Nonintractable headache, unspecified chronicity pattern, unspecified headache type    New Prescriptions Discharge Medication List as of 01/23/2017  5:57 PM    START taking these medications   Details  cyclobenzaprine (FLEXERIL) 10 MG tablet Take 1 tablet (10 mg total) by mouth 2 (two) times daily as needed for muscle spasms., Starting Tue 01/23/2017, Print         [address] [place], [place] 01/24/17 0145    Nira Conn, MD 01/28/17 [phone]

## 2017-04-20 ENCOUNTER — Other Ambulatory Visit: Payer: Self-pay | Admitting: Infectious Diseases

## 2017-04-20 DIAGNOSIS — G4700 Insomnia, unspecified: Secondary | ICD-10-CM

## 2017-04-27 ENCOUNTER — Ambulatory Visit: Payer: Self-pay

## 2017-04-27 ENCOUNTER — Other Ambulatory Visit: Payer: Self-pay | Admitting: *Deleted

## 2017-04-27 DIAGNOSIS — B20 Human immunodeficiency virus [HIV] disease: Secondary | ICD-10-CM

## 2017-04-27 MED ORDER — DOLUTEGRAVIR SODIUM 50 MG PO TABS: 50.0000 mg | ORAL_TABLET | Freq: Every day | ORAL | 5 refills | Status: DC

## 2017-04-27 MED ORDER — DESCOVY 200-25 MG PO TABS: 1.0000 | ORAL_TABLET | Freq: Every day | ORAL | 5 refills | Status: DC

## 2017-05-16 ENCOUNTER — Ambulatory Visit: Payer: Self-pay | Admitting: Infectious Diseases

## 2017-05-28 ENCOUNTER — Other Ambulatory Visit: Payer: Self-pay | Admitting: *Deleted

## 2017-05-28 DIAGNOSIS — B20 Human immunodeficiency virus [HIV] disease: Secondary | ICD-10-CM

## 2017-05-28 MED ORDER — DESCOVY 200-25 MG PO TABS: 1.0000 | ORAL_TABLET | Freq: Every day | ORAL | 2 refills | Status: DC

## 2017-05-29 ENCOUNTER — Encounter: Payer: Self-pay | Admitting: Internal Medicine

## 2017-05-31 ENCOUNTER — Ambulatory Visit (INDEPENDENT_AMBULATORY_CARE_PROVIDER_SITE_OTHER): Payer: Self-pay | Admitting: Internal Medicine

## 2017-05-31 ENCOUNTER — Encounter: Payer: Self-pay | Admitting: Internal Medicine

## 2017-05-31 VITALS — BP 121/72 | HR 69 | Temp 98.8°F | Ht 65.0 in | Wt 146.0 lb

## 2017-05-31 DIAGNOSIS — R21 Rash and other nonspecific skin eruption: Secondary | ICD-10-CM

## 2017-05-31 DIAGNOSIS — B20 Human immunodeficiency virus [HIV] disease: Secondary | ICD-10-CM

## 2017-05-31 DIAGNOSIS — F5101 Primary insomnia: Secondary | ICD-10-CM

## 2017-05-31 DIAGNOSIS — D649 Anemia, unspecified: Secondary | ICD-10-CM

## 2017-05-31 LAB — COMPLETE METABOLIC PANEL WITH GFR
ALT: 9 U/L (ref 6–29)
AST: 13 U/L (ref 10–30)
Albumin: 4.7 g/dL (ref 3.6–5.1)
Alkaline Phosphatase: 34 U/L (ref 33–115)
BUN: 18 mg/dL (ref 7–25)
CALCIUM: 9.6 mg/dL (ref 8.6–10.2)
CHLORIDE: 108 mmol/L (ref 98–110)
CO2: 25 mmol/L (ref 20–31)
CREATININE: 0.85 mg/dL (ref 0.50–1.10)
GFR, Est African American: >89 mL/min (ref >=60)
GFR, Est Non African American: >89 mL/min (ref >=60)
GLUCOSE: 77 mg/dL (ref 65–99)
Potassium: 3.8 mmol/L (ref 3.5–5.3)
SODIUM: 140 mmol/L (ref 135–146)
Total Bilirubin: 0.4 mg/dL (ref 0.2–1.2)
Total Protein: 7 g/dL (ref 6.1–8.1)

## 2017-05-31 LAB — CBC WITH DIFFERENTIAL/PLATELET
BASOS PCT: 2 %
Basophils Absolute: 96 cells/uL (ref 0–200)
Eosinophils Absolute: 96 cells/uL (ref 15–500)
Eosinophils Relative: 2 %
HEMATOCRIT: 39 % (ref 35.0–45.0)
HEMOGLOBIN: 12.7 g/dL (ref 11.7–15.5)
LYMPHS ABS: 1776 {cells}/uL (ref 850–3900)
LYMPHS PCT: 37 %
MCH: 31.5 pg (ref 27.0–33.0)
MCHC: 32.6 g/dL (ref 32.0–36.0)
MCV: 96.8 fL (ref 80.0–100.0)
MONO ABS: 384 {cells}/uL (ref 200–950)
MPV: 11.1 fL (ref 7.5–12.5)
Monocytes Relative: 8 %
Neutro Abs: 2448 cells/uL (ref 1500–7800)
Neutrophils Relative %: 51 %
Platelets: 197 10*3/uL (ref 140–400)
RBC: 4.03 MIL/uL (ref 3.80–5.10)
RDW: 13.3 % (ref 11.0–15.0)
WBC: 4.8 10*3/uL (ref 3.8–10.8)

## 2017-05-31 LAB — IRON AND TIBC
%SAT: 12 % (ref 11–50)
Iron: 36 ug/dL — ABNORMAL LOW (ref 40–190)
TIBC: 309 ug/dL (ref 250–450)
UIBC: 273 ug/dL

## 2017-05-31 LAB — LIPID PANEL
Cholesterol: 184 mg/dL (ref <200)
HDL: 75 mg/dL (ref >50)
LDL CALC: 99 mg/dL (ref <100)
Total CHOL/HDL Ratio: 2.5 Ratio (ref <5.0)
Triglycerides: 51 mg/dL (ref <150)
VLDL: 10 mg/dL (ref <30)

## 2017-05-31 NOTE — Patient Instructions (Signed)
Hydrocortisone cream 1% apply to affected areas twice a day x 2 wk  Take trazodone nightly x 7 days
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
## 2017-06-01 LAB — RPR

## 2017-06-01 LAB — TSH: TSH: 2.3 m[IU]/L

## 2017-06-01 LAB — T-HELPER CELL (CD4) - (RCID CLINIC ONLY)
CD4 % Helper T Cell: 43 % (ref 33–55)
CD4 T CELL ABS: 780 /uL (ref 400–2700)

## 2017-06-01 LAB — VITAMIN B12: Vitamin B-12: 359 pg/mL (ref 200–1100)

## 2017-06-01 LAB — T4, FREE: FREE T4: 1.4 ng/dL (ref 0.8–1.8)

## 2017-06-01 LAB — FERRITIN: Ferritin: 14 ng/mL (ref 10–154)

## 2017-06-05 LAB — HIV-1 RNA QUANT-NO REFLEX-BLD
HIV 1 RNA Quant: <20 copies/mL
HIV-1 RNA Quant, Log: <1.3 Log copies/mL

## 2017-07-05 ENCOUNTER — Telehealth: Payer: Self-pay | Admitting: *Deleted

## 2017-07-05 NOTE — Telephone Encounter (Signed)
Patient's needs assistance for medication. She is over income for [facility].  She has [facility] for [employer], needs [employer] Patient assistance for [employer]ovy. Andree CossHowell, Shene Maxfield M, RN

## 2017-08-02 ENCOUNTER — Encounter: Payer: Self-pay | Admitting: Internal Medicine

## 2017-08-02 ENCOUNTER — Ambulatory Visit (INDEPENDENT_AMBULATORY_CARE_PROVIDER_SITE_OTHER): Payer: Self-pay | Admitting: Internal Medicine

## 2017-08-02 VITALS — BP 116/77 | HR 71 | Temp 97.9°F | Ht 67.0 in | Wt 145.0 lb

## 2017-08-02 DIAGNOSIS — B20 Human immunodeficiency virus [HIV] disease: Secondary | ICD-10-CM

## 2017-08-02 MED ORDER — BICTEGRAVIR-EMTRICITAB-TENOFOV 50-200-25 MG PO TABS: 1.0000 | ORAL_TABLET | Freq: Every day | ORAL | 6 refills | Status: DC

## 2017-08-02 NOTE — Progress Notes (Signed)
HPI: Samantha Fleming is a 36 y.o. female who presents to the [facility] to follow-up with Dr. Drue SecondSnider for her HIV infection.   Allergies: Allergies  Allergen Reactions  . Asa [Aspirin] Swelling  . Rilpivirine Diarrhea    Patient had severe gastrointestinal complaints including diarrhea while on COMPLERA. She DID NOT HAVE ANAPHYLAXIS AND SHE HAS TOLERATED THE OTHER 2 COMPONENTS OF COMPLERA TNF AND EMTRICTABINE WITHOUT ANY PROBLEMS    Past Medical History: Past Medical History:  Diagnosis Date  . Anxiety   . Depression   . Fibromyalgia 04/29/2015  . GAD (generalized anxiety disorder) 04/29/2015  . History of hepatitis C 04/29/2015  . 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 History of intravenous drug use in remission   . HIV disease (HCC) 04/29/2015  . Insomnia 09/15/2015  . Irregular menses 04/29/2015  . Left leg pain 09/15/2015  . Neuromuscular disorder (HCC)   . Pain of left calf 10/06/2015  . PTSD (post-traumatic stress disorder) 04/29/2015  . Reduced libido 09/15/2015  . Smoker 04/29/2015  . Tremor 10/06/2015  . Vaginitis and vulvovaginitis 04/29/2015  . Yeast infection 09/15/2015    Social History: Social History   Social History  . Marital status: Single    Spouse name: N/A  . Number of children: N/A  . Years of education: N/A   Social History Main Topics  . Smoking status: Current Every Day Smoker    Packs/day: 1.00    Years: 16.00    Types: Cigarettes  . Smokeless tobacco: Never Used  . Alcohol use 0.6 oz/week    1 Glasses of wine per week     Comment: occasional  . Drug use: Yes    Types: Marijuana     Comment: history of polysubstance drug use   . Sexual activity: Yes    Partners: Male    Birth control/ protection: None     Comment: declined condoms   Other Topics Concern  . None   Social History Narrative  . None    Current Regimen: Tivicay + Descovy  Labs: HIV 1 RNA Quant (copies/mL)  Date Value  05/31/2017 <20 NOT DETECTED   11/21/2016 <20  06/29/2016 <20   CD4 T Cell Abs (/uL)  Date Value  05/31/2017 780  11/21/2016 560  06/29/2016 460   Hep B S Ab (no units)  Date Value  04/14/2015 POS (A)   Hepatitis B Surface Ag (no units)  Date Value  04/14/2015 NEGATIVE   HCV Ab (no units)  Date Value  04/14/2015 REACTIVE (A)    CrCl: CrCl cannot be calculated (Patient's most recent lab result is older than the maximum 21 days allowed.).  Lipids:    Component Value Date/Time   CHOL 184 05/31/2017 1711   TRIG 51 05/31/2017 1711   HDL 75 05/31/2017 1711   CHOLHDL 2.5 05/31/2017 1711   VLDL 10 05/31/2017 1711   LDLCALC 99 05/31/2017 1711    Assessment: Samantha is here today to follow-up with Dr. Drue SecondSnider for her HIV infection. She has been taking Tivicay and Descovy.  She was getting her medications through [facility] but we recently cancelled our contract with [facility], so they are not able to fill HIV medications for our patients anymore.  She has been out medications for ~3 days because of this.  We decided to [facility] for simplification and a one pill once daily regimen.  I called [employer] for advancing access and got a 30 day supply  for her.  I called [facility] and gave them the information.  Her ADAP is pending.  I also sent in a full application for patient assistance through [place] and will hear back as far as her being approved >30 days soon. Will bring her back in 3-4 weeks since she is changing medications.   Plans: - Stop Tivicay and Descovy - Start Biktarvy PO once daily through [place] - F/u with me 9/13 at 2:30pm  [name] L. Enio Hornback, PharmD, CPP Infectious Diseases Clinical Pharmacist [facility] 08/02/2017, 2:45 PM

## 2017-08-02 NOTE — Progress Notes (Signed)
Patient ID: Samantha Fleming, female   DOB: Dec 29, 1980, 36 y.o.   MRN: [id number]  HPI Samantha Fleming is a 36yo F with HIV disease, CD 4 count of 780/LV<20 in June 2018 - Applying for adap, getting descovy/tivicay. She previously was getting her hiv medications through [facility] which no longer is helping our hiv patients. She is somewhat distraught that she does not have access to her hiv meds  She is meeting with michelle to help fill out paperwork to get quick access to meds Outpatient Encounter Prescriptions as of 08/02/2017  Medication Sig  . DESCOVY 200-25 MG tablet Take 1 tablet by mouth daily.  . dolutegravir (TIVICAY) 50 MG tablet Take 1 tablet (50 mg total) by mouth daily.  Marland Kitchen ibuprofen (ADVIL,MOTRIN) 800 MG tablet Take 1 tablet (800 mg total) by mouth every 8 (eight) hours as needed.  . traZODone (DESYREL) 50 MG tablet TAKE 1 TABLET BY MOUTH EVERY NIGHT AT BEDTIME   No facility-administered encounter medications on file as of 08/02/2017.      Patient Active Problem List   Diagnosis Date Noted  . Acute frontal sinusitis 11/21/2016  . Post-operative state 10/05/2016  . Tremor 10/06/2015  . Reduced libido 09/15/2015  . Insomnia 09/15/2015  . 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 Hepatitis C antibody test positive 04/29/2015  . HIV disease (HCC) 04/29/2015  . PTSD (post-traumatic stress disorder) 04/29/2015  . Irregular menses 04/29/2015  . GAD (generalized anxiety disorder) 04/29/2015  . 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 Smoker 04/29/2015  . Fibromyalgia 04/29/2015  . Breast pain, left 03/31/2015     Health Maintenance Due  Topic Date Due  . TETANUS/TDAP  09/13/2001  . INFLUENZA VACCINE  07/25/2017     Review of Systems Review of Systems  Constitutional: Negative for fever, chills, diaphoresis, activity change, appetite change, fatigue and unexpected weight change.  HENT: Negative for congestion, sore throat, rhinorrhea,  sneezing, trouble swallowing and sinus pressure.  Eyes: Negative for photophobia and visual disturbance.  Respiratory: Negative for cough, chest tightness, shortness of breath, wheezing and stridor.  Cardiovascular: Negative for chest pain, palpitations and leg swelling.  Gastrointestinal: Negative for nausea, vomiting, abdominal pain, diarrhea, constipation, blood in stool, abdominal distention and anal bleeding.  Genitourinary: Negative for dysuria, hematuria, flank pain and difficulty urinating.  Musculoskeletal: Negative for myalgias, back pain, joint swelling, arthralgias and gait problem.  Skin: Negative for color change, pallor, rash and wound.  Neurological: Negative for dizziness, tremors, weakness and light-headedness.  Hematological: Negative for adenopathy. Does not bruise/bleed easily.  Psychiatric/Behavioral: Negative for behavioral problems, confusion, sleep disturbance, dysphoric mood, decreased concentration and agitation.    Physical Exam   BP 116/77   Pulse 71   Temp 97.9 F (36.6 C) (Oral)   Ht 5\' 7"  (1.702 m)   Wt 145 lb (65.8 kg)   BMI 22.71 kg/m   Physical Exam  Constitutional:  oriented to person, place, and time. appears well-developed and well-nourished. No distress.  HENT: [place]/AT, PERRLA, no scleral icterus Mouth/Throat: Oropharynx is clear and moist. No oropharyngeal exudate.  Cardiovascular: Normal rate, regular rhythm and normal heart sounds. Exam reveals no gallop and no friction rub.  No murmur heard.  Pulmonary/Chest: Effort normal and breath sounds normal. No respiratory distress.  has no wheezes.  Neck = supple, no nuchal rigidity Abdominal: Soft. Bowel sounds are normal.  exhibits no distension. There is no tenderness.  Lymphadenopathy: no cervical adenopathy. No axillary adenopathy Neurological: alert and  oriented to person, place, and time.  Skin: Skin is warm and dry. No rash noted. No erythema.  Psychiatric: a normal mood and affect.  behavior  is normal.   Lab Results  Component Value Date   CD4TCELL 43 05/31/2017   Lab Results  Component Value Date   CD4TABS 780 05/31/2017   CD4TABS 560 11/21/2016   CD4TABS 460 06/29/2016   Lab Results  Component Value Date   HIV1RNAQUANT <20 NOT DETECTED 05/31/2017   Lab Results  Component Value Date   HEPBSAB POS (A) 04/14/2015   Lab Results  Component Value Date   LABRPR NON REAC 05/31/2017    CBC Lab Results  Component Value Date   WBC 4.8 05/31/2017   RBC 4.03 05/31/2017   HGB 12.7 05/31/2017   HCT 39.0 05/31/2017   PLT 197 05/31/2017   MCV 96.8 05/31/2017   MCH 31.5 05/31/2017   MCHC 32.6 05/31/2017   RDW 13.3 05/31/2017   LYMPHSABS 1,776 05/31/2017   MONOABS 384 05/31/2017   EOSABS 96 05/31/2017    BMET Lab Results  Component Value Date   NA 140 05/31/2017   K 3.8 05/31/2017   CL 108 05/31/2017   CO2 25 05/31/2017   GLUCOSE 77 05/31/2017   BUN 18 05/31/2017   CREATININE 0.85 05/31/2017   CALCIUM 9.6 05/31/2017   GFRNONAA >89 05/31/2017   GFRAA >89 05/31/2017      Assessment and Plan  HIV disease =  Missing 2 doses of since ran out of meds [facility]. Will plan to get her switched to biktarvy as it is a smaller pill. One pill per day and can get quicker access so that she starts back today  She is meeting with cassie, clinical pharmacist who will check back with her in 1 month to evaluate adherence.

## 2017-08-03 ENCOUNTER — Other Ambulatory Visit: Payer: Self-pay | Admitting: Pharmacist

## 2017-08-08 ENCOUNTER — Other Ambulatory Visit: Payer: Self-pay | Admitting: Pharmacist

## 2017-09-06 ENCOUNTER — Ambulatory Visit (INDEPENDENT_AMBULATORY_CARE_PROVIDER_SITE_OTHER): Payer: Self-pay | Admitting: Pharmacist

## 2017-09-06 DIAGNOSIS — B20 Human immunodeficiency virus [HIV] disease: Secondary | ICD-10-CM

## 2017-09-06 NOTE — Progress Notes (Addendum)
HPI: Samantha Fleming [place] is a 36 y.o. female who presents to the [facility] pharmacy clinic for follow-up of her HIV infection.   Allergies: Allergies  Allergen Reactions  . Asa [Aspirin] Swelling  . Rilpivirine Diarrhea    Patient had severe gastrointestinal complaints including diarrhea while on COMPLERA. She DID NOT HAVE ANAPHYLAXIS AND SHE HAS TOLERATED THE OTHER 2 COMPONENTS OF COMPLERA TNF AND EMTRICTABINE WITHOUT ANY PROBLEMS    Past Medical History: Past Medical History:  Diagnosis Date  . Anxiety   . Depression   . Fibromyalgia 04/29/2015  . GAD (generalized anxiety disorder) 04/29/2015  . History of hepatitis C 04/29/2015  . 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 History of intravenous drug use in remission   . HIV disease (HCC) 04/29/2015  . Insomnia 09/15/2015  . Irregular menses 04/29/2015  . Left leg pain 09/15/2015  . Neuromuscular disorder (HCC)   . Pain of left calf 10/06/2015  . PTSD (post-traumatic stress disorder) 04/29/2015  . Reduced libido 09/15/2015  . Smoker 04/29/2015  . Tremor 10/06/2015  . Vaginitis and vulvovaginitis 04/29/2015  . Yeast infection 09/15/2015    Social History: Social History   Social History  . Marital status: Single    Spouse name: N/A  . Number of children: N/A  . Years of education: N/A   Social History Main Topics  . Smoking status: Current Every Day Smoker    Packs/day: 1.00    Years: 16.00    Types: Cigarettes  . Smokeless tobacco: Never Used  . Alcohol use 0.6 oz/week    1 Glasses of wine per week     Comment: occasional  . Drug use: Yes    Types: Marijuana     Comment: history of polysubstance drug use   . Sexual activity: Yes    Partners: Male    Birth control/ protection: None     Comment: declined condoms   Other Topics Concern  . Not on file   Social History Narrative  . No narrative on file    Current Regimen: Biktarvy  Labs: HIV 1 RNA Quant (copies/mL)  Date Value  05/31/2017 <20 NOT DETECTED   11/21/2016 <20  06/29/2016 <20   CD4 T Cell Abs (/uL)  Date Value  05/31/2017 780  11/21/2016 560  06/29/2016 460   Hep B S Ab (no units)  Date Value  04/14/2015 POS (A)   Hepatitis B Surface Ag (no units)  Date Value  04/14/2015 NEGATIVE   HCV Ab (no units)  Date Value  04/14/2015 REACTIVE (A)    CrCl: CrCl cannot be calculated (Patient's most recent lab result is older than the maximum 21 days allowed.).  Lipids:    Component Value Date/Time   CHOL 184 05/31/2017 1711   TRIG 51 05/31/2017 1711   HDL 75 05/31/2017 1711   CHOLHDL 2.5 05/31/2017 1711   VLDL 10 05/31/2017 1711   LDLCALC 99 05/31/2017 1711    Assessment: Samantha Fleming is here today to follow-up for her HIV infection. She was recently switched from [place] and Descovy to [place]vy.  She was on [facility] several months ago but our contract was terminated and she was out of medications for 3 days due to this.  I was able to get her a 30 day advancing access prescription from [facility].  I was then able to get approval for a year for [place]iktarvy through [facility].  She received a prescription card in the mail.  She is currently getting it at  [facility] but is unhappy with her service there.  I told her that we could start filling it at [facility]. She will call to arrange that.   She is doing ok on her Biktarvy.  She is complaining of not feeling well for the last few days - tired and nauseous.  She said a few people at work are sick too. She also says that she used to have pain, tingling, and numbness in her feet that was helped when she went on birth control.  She is off of birth control now and the pain/tingling/numbness has resurfaced. I told her I would let Dr. Drue Second know.  I will get a HIV RNA today to assess on the new medication. She will come back and see Dr. Drue Second in 3 months. Her Juanell Fairly is approved and her ADAP is still pending, but that's ok as she has approval through [facility] for quite some time.   Plans: -  Continue Biktarvy PO once daily - HIV RNA today - F/u with Dr. Drue Second in 3 months  Cassie L. Kuppelweiser, PharmD, CPP Infectious Diseases Clinical Pharmacist [facility] 09/06/2017, 3:01 PM

## 2017-09-10 LAB — HIV-1 RNA QUANT-NO REFLEX-BLD
HIV 1 RNA QUANT: NOT DETECTED {copies}/mL
HIV-1 RNA QUANT, LOG: NOT DETECTED {Log_copies}/mL

## 2017-09-28 ENCOUNTER — Telehealth: Payer: Self-pay | Admitting: *Deleted

## 2017-09-28 NOTE — Telephone Encounter (Signed)
Patient called to advise she has been sick for about 1 week with nasal drainage that is thick and green, cough and body aches. She wants to see her provider as soon as possible as she feels she is getting worse. Gave her an appt for Monday 10/01/17 with Dr Drue Second.

## 2017-10-01 ENCOUNTER — Ambulatory Visit (INDEPENDENT_AMBULATORY_CARE_PROVIDER_SITE_OTHER): Payer: Self-pay | Admitting: Internal Medicine

## 2017-10-01 ENCOUNTER — Encounter: Payer: Self-pay | Admitting: Internal Medicine

## 2017-10-01 VITALS — BP 165/95 | HR 67 | Temp 98.2°F | Wt 144.0 lb

## 2017-10-01 DIAGNOSIS — B20 Human immunodeficiency virus [HIV] disease: Secondary | ICD-10-CM

## 2017-10-01 DIAGNOSIS — R062 Wheezing: Secondary | ICD-10-CM

## 2017-10-01 DIAGNOSIS — J069 Acute upper respiratory infection, unspecified: Secondary | ICD-10-CM

## 2017-10-01 MED ORDER — PREDNISONE 20 MG PO TABS: 40.0000 mg | ORAL_TABLET | Freq: Every day | ORAL | 0 refills | Status: DC

## 2017-10-01 MED ORDER — ALBUTEROL SULFATE HFA 108 (90 BASE) MCG/ACT IN AERS: 2.0000 | INHALATION_SPRAY | Freq: Four times a day (QID) | RESPIRATORY_TRACT | 2 refills | Status: DC | PRN

## 2017-10-01 MED ORDER — GUAIFENESIN-CODEINE 100-10 MG/5ML PO SOLN: 5.0000 mL | Freq: Four times a day (QID) | ORAL | 0 refills | Status: DC | PRN

## 2017-10-01 NOTE — Progress Notes (Signed)
RFV: follow up for hiv disease but mostly for uri  Patient ID: Samantha Fleming, female   DOB: 17-Sep-1981, 36 y.o.   MRN: [id number]  HPI Samantha Fleming is a 36yo F with hiv disease, currently on biktarvy, CD 4 count of 780/VL<20 ( sep 2018) doing well with taking medicaitons. She does report having a URI for the past 7 days but does not feel that she is improving. She has been taking dayquil and theraflu without significant improvement. She is having non productive cough but has difficulty going to sleep at night. Has some shortness of breath/wheezing. Has not smoked during the course of her illness due to feeling poorly.  Outpatient Encounter Prescriptions as of 10/01/2017  Medication Sig  . bictegravir-emtricitabine-tenofovir AF (BIKTARVY) 50-200-25 MG TABS tablet Take 1 tablet by mouth daily.  Marland Kitchen ibuprofen (ADVIL,MOTRIN) 800 MG tablet Take 1 tablet (800 mg total) by mouth every 8 (eight) hours as needed.  . traZODone (DESYREL) 50 MG tablet TAKE 1 TABLET BY MOUTH EVERY NIGHT AT BEDTIME   No facility-administered encounter medications on file as of 10/01/2017.      Patient Active Problem List   Diagnosis Date Noted  . Acute frontal sinusitis 11/21/2016  . Post-operative state 10/05/2016  . Tremor 10/06/2015  . Reduced libido 09/15/2015  . Insomnia 09/15/2015  . 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 Hepatitis C antibody test positive 04/29/2015  . HIV disease (HCC) 04/29/2015  . PTSD (post-traumatic stress disorder) 04/29/2015  . Irregular menses 04/29/2015  . GAD (generalized anxiety disorder) 04/29/2015  . 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 Smoker 04/29/2015  . Fibromyalgia 04/29/2015  . Breast pain, left 03/31/2015     Health Maintenance Due  Topic Date Due  . TETANUS/TDAP  09/13/2001  . INFLUENZA VACCINE  07/25/2017     Review of Systems Per hpi, otherwise 12 point ros is negative Physical Exam   BP (!) 165/95   Pulse 67    Temp 98.2 F (36.8 C) (Oral)   Wt 144 lb (65.3 kg)   BMI 22.55 kg/m  Physical Exam  Constitutional:  oriented to person, place, and time. appears well-developed and well-nourished. No distress.  HENT: [place]/AT, PERRLA, no scleral icterus Mouth/Throat: Oropharynx is clear and moist. No oropharyngeal exudate.  Cardiovascular: Normal rate, regular rhythm and normal heart sounds. Exam reveals no gallop and no friction rub.  No murmur heard.  Pulmonary/Chest: Effort normal and breath sounds normal. No respiratory distress.  + slight expiratory wheezing Neck = supple, no nuchal rigidity Abdominal: Soft. Bowel sounds are normal.  exhibits no distension. There is no tenderness.  Lymphadenopathy: no cervical adenopathy. No axillary adenopathy Neurological: alert and oriented to person, place, and time.  Skin: Skin is warm and dry. No rash noted. No erythema.  Psychiatric: a normal mood and affect.  behavior is normal.    Lab Results  Component Value Date   CD4TCELL 43 05/31/2017   Lab Results  Component Value Date   CD4TABS 780 05/31/2017   CD4TABS 560 11/21/2016   CD4TABS 460 06/29/2016   Lab Results  Component Value Date   HIV1RNAQUANT <20 NOT DETECTED 09/06/2017   Lab Results  Component Value Date   HEPBSAB POS (A) 04/14/2015   Lab Results  Component Value Date   LABRPR NON REAC 05/31/2017    CBC Lab Results  Component Value Date   WBC 4.8 05/31/2017   RBC 4.03 05/31/2017   HGB 12.7 05/31/2017   HCT 39.0 05/31/2017  PLT 197 05/31/2017   MCV 96.8 05/31/2017   MCH 31.5 05/31/2017   MCHC 32.6 05/31/2017   RDW 13.3 05/31/2017   LYMPHSABS 1,776 05/31/2017   MONOABS 384 05/31/2017   EOSABS 96 05/31/2017    BMET Lab Results  Component Value Date   NA 140 05/31/2017   K 3.8 05/31/2017   CL 108 05/31/2017   CO2 25 05/31/2017   GLUCOSE 77 05/31/2017   BUN 18 05/31/2017   CREATININE 0.85 05/31/2017   CALCIUM 9.6 05/31/2017   GFRNONAA >89 05/31/2017   GFRAA >89  05/31/2017      Assessment and Plan  Uri x 7 d= appears to have more reactive airway. Will give 4 d steroids burst, then also use inhaler prn, will give her robitussin codeine  hiv disease = well controlled, will continue with current regimen  Health maintenance = post pone getting flu vaccine until she recovers - likely next week

## 2017-10-02 ENCOUNTER — Ambulatory Visit: Payer: Self-pay | Admitting: Internal Medicine

## 2017-12-12 ENCOUNTER — Encounter: Payer: Self-pay | Admitting: Internal Medicine

## 2017-12-12 ENCOUNTER — Ambulatory Visit (INDEPENDENT_AMBULATORY_CARE_PROVIDER_SITE_OTHER): Payer: Self-pay | Admitting: Internal Medicine

## 2017-12-12 VITALS — BP 121/82 | HR 101 | Temp 98.4°F | Ht 65.0 in | Wt 142.0 lb

## 2017-12-12 DIAGNOSIS — F419 Anxiety disorder, unspecified: Secondary | ICD-10-CM

## 2017-12-12 DIAGNOSIS — B20 Human immunodeficiency virus [HIV] disease: Secondary | ICD-10-CM

## 2017-12-12 DIAGNOSIS — F329 Major depressive disorder, single episode, unspecified: Secondary | ICD-10-CM

## 2017-12-12 NOTE — Progress Notes (Signed)
RFV: follow up for hiv disease  Patient ID: Samantha Fleming, female   DOB: March 13, 1981, 36 y.o.   MRN: [id number]  HPI 36yo F with hiv disease, Cd 4 count of 780/VL<20, on biktarvy. Increased anxiety and depression. Loss of libido, that has been an issue for the past year. She reports good adherence with her hiv regimen. She subscribes to having preference for staying away from large gatherings during this holiday season which she thinks is a reflection of her depression. No thoughts of harm to herself. She went to [facility] in the past, but did not feel like it was a good fit for her.  Outpatient Encounter Medications as of 12/12/2017  Medication Sig  . albuterol (PROVENTIL HFA;VENTOLIN HFA) 108 (90 Base) MCG/ACT inhaler Inhale 2 puffs into the lungs every 6 (six) hours as needed for wheezing or shortness of breath.  . bictegravir-emtricitabine-tenofovir AF (BIKTARVY) 50-200-25 MG TABS tablet Take 1 tablet by mouth daily.  Marland Kitchen. guaiFENesin-codeine 100-10 MG/5ML syrup Take 5 mLs by mouth every 6 (six) hours as needed for cough.  Marland Kitchen. ibuprofen (ADVIL,MOTRIN) 800 MG tablet Take 1 tablet (800 mg total) by mouth every 8 (eight) hours as needed.  . predniSONE (DELTASONE) 20 MG tablet Take 2 tablets (40 mg total) by mouth daily with breakfast.  . traZODone (DESYREL) 50 MG tablet TAKE 1 TABLET BY MOUTH EVERY NIGHT AT BEDTIME (Patient not taking: Reported on 10/01/2017)   No facility-administered encounter medications on file as of 12/12/2017.      Patient Active Problem List   Diagnosis Date Noted  . Acute frontal sinusitis 11/21/2016  . Post-operative state 10/05/2016  . Tremor 10/06/2015  . Reduced libido 09/15/2015  . Insomnia 09/15/2015  . 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 Hepatitis C antibody test positive 04/29/2015  . HIV disease (HCC) 04/29/2015  . PTSD (post-traumatic stress disorder) 04/29/2015  . Irregular menses 04/29/2015  . GAD (generalized anxiety disorder)  04/29/2015  . 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 Smoker 04/29/2015  . Fibromyalgia 04/29/2015  . Breast pain, left 03/31/2015     Health Maintenance Due  Topic Date Due  . TETANUS/TDAP  09/13/2001  . INFLUENZA VACCINE  07/25/2017     Review of Systems Review of Systems  Constitutional: Negative for fever, chills, diaphoresis, activity change, appetite change, fatigue and unexpected weight change.  HENT: Negative for congestion, sore throat, rhinorrhea, sneezing, trouble swallowing and sinus pressure.  Eyes: Negative for photophobia and visual disturbance.  Respiratory: Negative for cough, chest tightness, shortness of breath, wheezing and stridor.  Cardiovascular: Negative for chest pain, palpitations and leg swelling.  Gastrointestinal: Negative for nausea, vomiting, abdominal pain, diarrhea, constipation, blood in stool, abdominal distention and anal bleeding.  Genitourinary: Negative for dysuria, hematuria, flank pain and difficulty urinating.  Musculoskeletal: Negative for myalgias, back pain, joint swelling, arthralgias and gait problem.  Skin: Negative for color change, pallor, rash and wound.  Neurological: Negative for dizziness, tremors, weakness and light-headedness.  Hematological: Negative for adenopathy. Does not bruise/bleed easily.  Psychiatric/Behavioral: Negative for behavioral problems, confusion, sleep disturbance, dysphoric mood, decreased concentration and agitation.     Physical Exam   BP 121/82   Pulse (!) 101   Temp 98.4 F (36.9 C) (Oral)   Ht 5\' 5"  (1.651 m)   Wt 142 lb (64.4 kg)   BMI 23.63 kg/m   Physical Exam  Constitutional:  oriented to person, place, and time. appears well-developed and well-nourished. No distress.  HENT: [place]/AT,  PERRLA, no scleral icterus Mouth/Throat: Oropharynx is clear and moist. No oropharyngeal exudate.  Cardiovascular: Normal rate, regular rhythm and normal heart sounds. Exam reveals no gallop  and no friction rub.  No murmur heard.  Pulmonary/Chest: Effort normal and breath sounds normal. No respiratory distress.  has no wheezes.  Neck = supple, no nuchal rigidity Lymphadenopathy: no cervical adenopathy. No axillary adenopathy Neurological: alert and oriented to person, place, and time.  Skin: Skin is warm and dry. No rash noted. No erythema.  Psychiatric: a normal mood and affect.  behavior is normal.   Lab Results  Component Value Date   CD4TCELL 43 05/31/2017   Lab Results  Component Value Date   CD4TABS 780 05/31/2017   CD4TABS 560 11/21/2016   CD4TABS 460 06/29/2016   Lab Results  Component Value Date   HIV1RNAQUANT <20 NOT DETECTED 09/06/2017   Lab Results  Component Value Date   HEPBSAB POS (A) 04/14/2015   Lab Results  Component Value Date   LABRPR NON REAC 05/31/2017    CBC Lab Results  Component Value Date   WBC 4.8 05/31/2017   RBC 4.03 05/31/2017   HGB 12.7 05/31/2017   HCT 39.0 05/31/2017   PLT 197 05/31/2017   MCV 96.8 05/31/2017   MCH 31.5 05/31/2017   MCHC 32.6 05/31/2017   RDW 13.3 05/31/2017   LYMPHSABS 1,776 05/31/2017   MONOABS 384 05/31/2017   EOSABS 96 05/31/2017    BMET Lab Results  Component Value Date   NA 140 05/31/2017   K 3.8 05/31/2017   CL 108 05/31/2017   CO2 25 05/31/2017   GLUCOSE 77 05/31/2017   BUN 18 05/31/2017   CREATININE 0.85 05/31/2017   CALCIUM 9.6 05/31/2017   GFRNONAA >89 05/31/2017   GFRAA >89 05/31/2017      Assessment and Plan  hiv disease = will continue on her current regimen. Plan on checking her viral load and cd 4 count  Anxiety/depression = we will have her see counselor tomorrow in addn to referral to family services who also prescribes MH meds

## 2017-12-12 NOTE — Patient Instructions (Signed)
Labs 2 wk before next visit 

## 2017-12-13 ENCOUNTER — Ambulatory Visit: Payer: Self-pay

## 2017-12-20 ENCOUNTER — Ambulatory Visit: Payer: Self-pay

## 2017-12-27 ENCOUNTER — Telehealth: Payer: Self-pay

## 2017-12-27 ENCOUNTER — Ambulatory Visit: Payer: Self-pay

## 2017-12-27 NOTE — Telephone Encounter (Signed)
Patient is requesting medication for anxiety (PTSD) and wanted to speak with you about increasing Trazodone for sleep.  Thank you!

## 2017-12-28 ENCOUNTER — Other Ambulatory Visit: Payer: Self-pay | Admitting: Internal Medicine

## 2017-12-28 MED ORDER — TRAZODONE HCL 100 MG PO TABS: 100.0000 mg | ORAL_TABLET | Freq: Every day | ORAL | 11 refills | Status: DC

## 2017-12-28 NOTE — Progress Notes (Signed)
Having anxiety. Increased her trazodone to 100mg  qhs. Will check back on Monday, consider short course of klonopin til she sees psychiatry

## 2017-12-31 ENCOUNTER — Ambulatory Visit (INDEPENDENT_AMBULATORY_CARE_PROVIDER_SITE_OTHER): Payer: Self-pay | Admitting: *Deleted

## 2017-12-31 DIAGNOSIS — F419 Anxiety disorder, unspecified: Secondary | ICD-10-CM

## 2017-12-31 MED ORDER — CLONAZEPAM 1 MG PO TABS: 1.0000 mg | ORAL_TABLET | Freq: Two times a day (BID) | ORAL | 0 refills | Status: DC | PRN

## 2017-12-31 NOTE — Patient Instructions (Signed)
RN gave instructions on the use of the Klonopin PRN rx and the need to see a psychiatrist ASAP.  Patient stated that she is saving money ($250) to go to the first office visit with [facility], [address], [place].

## 2017-12-31 NOTE — Progress Notes (Signed)
Walk-in to office, requesting medication for anxiety.  RN reviewed message from LCSW's office visit 12/27/17 asking Dr. Drue SecondSnider to recommend a medication for anxiety.  Phone call to Dr. Drue SecondSnider.  Received telephone order for Klonopin 1 mg tablet, 2 times daily PRN for anxiety, #10 tablets.  The patient needs to make an appointment with a psychiatrist ASAP to discuss this issue.  Dr. Daiva EvesVan Dam signed the prescription.  RN gave instructions on the use of the Klonopin PRN rx and the need to see a psychiatrist ASAP.  Patient stated that she is saving money ($250) to go to the first office visit with [facility], [address], [place].

## 2018-01-07 ENCOUNTER — Telehealth: Payer: Self-pay | Admitting: *Deleted

## 2018-01-07 DIAGNOSIS — F419 Anxiety disorder, unspecified: Secondary | ICD-10-CM

## 2018-01-07 MED ORDER — PAROXETINE HCL 20 MG PO TABS: ORAL_TABLET | ORAL | 1 refills | Status: DC

## 2018-01-07 MED ORDER — CLONAZEPAM 1 MG PO TABS: 1.0000 mg | ORAL_TABLET | Freq: Two times a day (BID) | ORAL | 0 refills | Status: DC | PRN

## 2018-01-07 NOTE — Telephone Encounter (Signed)
Has completed Psychiatric Evaluation, Psychiatrist appt 01/29/18 at 1130 with Dr. Cammy CopaAdegoroye at [facility], [address],  [place], [place].  Telephone, [phone].  Patient is requesting refill for Klonopin to get her to the 01/29/18 psychiatrist appointment.  RN received verbal order to refill Klonopin 1 mg tablet, 1 tablet by mouth twice daily as needed, #40 and patient to start Paxil 20 mg tablet, 1/2 tablet for 8 days daily, then, 1 tablet daily, #30 with one refill.  Prescriptions ready for pick up at [facility].  Ready in Triage Office.

## 2018-01-10 ENCOUNTER — Ambulatory Visit: Payer: Self-pay

## 2018-01-17 ENCOUNTER — Ambulatory Visit: Payer: Self-pay

## 2018-01-21 ENCOUNTER — Ambulatory Visit: Payer: Self-pay

## 2018-01-24 ENCOUNTER — Ambulatory Visit: Payer: Self-pay

## 2018-01-31 ENCOUNTER — Ambulatory Visit: Payer: Self-pay

## 2018-02-13 ENCOUNTER — Other Ambulatory Visit: Payer: Self-pay

## 2018-02-13 ENCOUNTER — Telehealth: Payer: Self-pay | Admitting: *Deleted

## 2018-02-13 NOTE — Telephone Encounter (Signed)
Patient called to advise that she has seen the therapist and they will not give her the klonopin because it is controled and she wants to know why we will not prescribe it for her. She advised she sees Marylu LundJanet here on Thursdays and is doing well with that and wants to know if Dr Osvaldo HumanSnide will continue to write her medications. Advised her not sure but will ask the doctor about that.

## 2018-02-13 NOTE — Telephone Encounter (Signed)
Please let her know that when I gave her klonopin it was a one time to bridge to see psychiatrist to provide something other benzos.

## 2018-02-14 ENCOUNTER — Other Ambulatory Visit: Payer: Self-pay | Admitting: *Deleted

## 2018-02-14 ENCOUNTER — Telehealth: Payer: Self-pay | Admitting: *Deleted

## 2018-02-14 ENCOUNTER — Ambulatory Visit: Payer: Self-pay

## 2018-02-14 ENCOUNTER — Other Ambulatory Visit: Payer: Self-pay

## 2018-02-14 DIAGNOSIS — F419 Anxiety disorder, unspecified: Secondary | ICD-10-CM

## 2018-02-14 DIAGNOSIS — B20 Human immunodeficiency virus [HIV] disease: Secondary | ICD-10-CM

## 2018-02-14 MED ORDER — PAROXETINE HCL 20 MG PO TABS: ORAL_TABLET | ORAL | 2 refills | Status: DC

## 2018-02-14 NOTE — Telephone Encounter (Signed)
error 

## 2018-02-15 ENCOUNTER — Encounter: Payer: Self-pay | Admitting: Internal Medicine

## 2018-02-15 LAB — CBC WITH DIFFERENTIAL/PLATELET
Basophils Absolute: 70 cells/uL (ref 0–200)
Basophils Relative: 2 %
EOS PCT: 1.1 %
Eosinophils Absolute: 39 cells/uL (ref 15–500)
HEMATOCRIT: 37.7 % (ref 35.0–45.0)
Hemoglobin: 12.9 g/dL (ref 11.7–15.5)
LYMPHS ABS: 1250 {cells}/uL (ref 850–3900)
MCH: 32.4 pg (ref 27.0–33.0)
MCHC: 34.2 g/dL (ref 32.0–36.0)
MCV: 94.7 fL (ref 80.0–100.0)
MPV: 10.9 fL (ref 7.5–12.5)
Monocytes Relative: 10.9 %
NEUTROS ABS: 1761 {cells}/uL (ref 1500–7800)
Neutrophils Relative %: 50.3 %
Platelets: 189 10*3/uL (ref 140–400)
RBC: 3.98 10*6/uL (ref 3.80–5.10)
RDW: 11.7 % (ref 11.0–15.0)
Total Lymphocyte: 35.7 %
WBC mixed population: 382 cells/uL (ref 200–950)
WBC: 3.5 10*3/uL — ABNORMAL LOW (ref 3.8–10.8)

## 2018-02-15 LAB — BASIC METABOLIC PANEL
BUN: 12 mg/dL (ref 7–25)
CO2: 28 mmol/L (ref 20–32)
Calcium: 9.2 mg/dL (ref 8.6–10.2)
Chloride: 104 mmol/L (ref 98–110)
Creat: 0.73 mg/dL (ref 0.50–1.10)
Glucose, Bld: 91 mg/dL (ref 65–99)
POTASSIUM: 3.9 mmol/L (ref 3.5–5.3)
Sodium: 139 mmol/L (ref 135–146)

## 2018-02-15 LAB — T-HELPER CELL (CD4) - (RCID CLINIC ONLY)
CD4 % Helper T Cell: 39 % (ref 33–55)
CD4 T Cell Abs: 510 /uL (ref 400–2700)

## 2018-02-16 LAB — HIV-1 RNA QUANT-NO REFLEX-BLD
HIV 1 RNA Quant: <20 copies/mL
HIV-1 RNA Quant, Log: <1.3 Log copies/mL

## 2018-02-18 NOTE — Telephone Encounter (Signed)
Patient aware and will get a PCP

## 2018-02-21 ENCOUNTER — Ambulatory Visit: Payer: Self-pay

## 2018-02-27 ENCOUNTER — Encounter: Payer: Self-pay | Admitting: Internal Medicine

## 2018-02-27 ENCOUNTER — Ambulatory Visit: Payer: Self-pay | Admitting: Internal Medicine

## 2018-02-27 ENCOUNTER — Ambulatory Visit (INDEPENDENT_AMBULATORY_CARE_PROVIDER_SITE_OTHER): Payer: Self-pay | Admitting: Internal Medicine

## 2018-02-27 VITALS — BP 126/87 | HR 87 | Temp 98.5°F | Ht 65.0 in | Wt 142.0 lb

## 2018-02-27 DIAGNOSIS — Z8659 Personal history of other mental and behavioral disorders: Secondary | ICD-10-CM

## 2018-02-27 DIAGNOSIS — B20 Human immunodeficiency virus [HIV] disease: Secondary | ICD-10-CM

## 2018-02-27 DIAGNOSIS — F419 Anxiety disorder, unspecified: Secondary | ICD-10-CM

## 2018-02-27 MED ORDER — PAROXETINE HCL 40 MG PO TABS: 40.0000 mg | ORAL_TABLET | ORAL | 11 refills | Status: DC

## 2018-02-27 MED ORDER — BUSPIRONE HCL 10 MG PO TABS: 10.0000 mg | ORAL_TABLET | Freq: Three times a day (TID) | ORAL | 3 refills | Status: DC

## 2018-02-27 NOTE — Progress Notes (Signed)
RFV: follow up for hiv disease  Patient ID: Samantha Fleming, female   DOB: 08-Jan-1981, 37 y.o.   MRN: [id number]  HPI Samantha Fleming is a 37yo F with hiv disease, anxiety/PTSD. Currently taking biktarvy. At last appointment appeared to be acute anxious, describing panic attack. We restarted her on paxil and gave her klonopin for breakthrough but has not been able to establish mental health relationship in [place]. She is seeking referral in [place]  Outpatient Encounter Medications as of 02/27/2018  Medication Sig  . albuterol (PROVENTIL HFA;VENTOLIN HFA) 108 (90 Base) MCG/ACT inhaler Inhale 2 puffs into the lungs every 6 (six) hours as needed for wheezing or shortness of breath.  . bictegravir-emtricitabine-tenofovir AF (BIKTARVY) 50-200-25 MG TABS tablet Take 1 tablet by mouth daily.  . clonazePAM (KLONOPIN) 1 MG tablet Take 1 tablet (1 mg total) by mouth 2 (two) times daily as needed for anxiety ([facility].  Psychiatrist to order additional refills.)).  Marland Kitchen. guaiFENesin-codeine 100-10 MG/5ML syrup Take 5 mLs by mouth every 6 (six) hours as needed for cough. (Patient not taking: Reported on 12/12/2017)  . ibuprofen (ADVIL,MOTRIN) 800 MG tablet Take 1 tablet (800 mg total) by mouth every 8 (eight) hours as needed.  Marland Kitchen. PARoxetine (PAXIL) 20 MG tablet 1 tablet daily  . traZODone (DESYREL) 100 MG tablet Take 1 tablet (100 mg total) by mouth at bedtime.   No facility-administered encounter medications on file as of 02/27/2018.      Patient Active Problem List   Diagnosis Date Noted  . Acute frontal sinusitis 11/21/2016  . Post-operative state 10/05/2016  . Tremor 10/06/2015  . Reduced libido 09/15/2015  . Insomnia 09/15/2015  . 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 Hepatitis C antibody test positive 04/29/2015  . HIV disease (HCC) 04/29/2015  . PTSD (post-traumatic stress disorder) 04/29/2015  . Irregular  menses 04/29/2015  . GAD (generalized anxiety disorder) 04/29/2015  . 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 Smoker 04/29/2015  . Fibromyalgia 04/29/2015  . Breast pain, left 03/31/2015     Health Maintenance Due  Topic Date Due  . TETANUS/TDAP  09/13/2001  . INFLUENZA VACCINE  07/25/2017     Review of Systems + anxious. 12 point ros is otherwise negative Physical Exam   BP 126/87   Pulse 87   Temp 98.5 F (36.9 C) (Oral)   Ht 5\' 5"  (1.651 m)   Wt 142 lb (64.4 kg)   BMI 23.63 kg/m   Physical Exam  Constitutional:  oriented to person, place, and time. appears well-developed and well-nourished. No distress.  HENT: [place]/AT, PERRLA, no scleral icterus Mouth/Throat: Oropharynx is clear and moist. No oropharyngeal exudate.  Cardiovascular: Normal rate, regular rhythm and normal heart sounds. Exam reveals no gallop and no friction rub.  No murmur heard.  Pulmonary/Chest: Effort normal and breath sounds normal. No respiratory distress.  has no wheezes.  Neck = supple, no nuchal rigidity Abdominal: Soft. Bowel sounds are normal.  exhibits no distension. There is no tenderness.  Lymphadenopathy: no cervical adenopathy. No axillary adenopathy Neurological: alert and oriented to person, place, and time.  Skin: Skin is warm and dry. No rash noted. No erythema.  Psychiatric: a normal mood and affect.  behavior is normal.   Lab Results  Component Value Date   CD4TCELL 39 02/14/2018   Lab Results  Component Value Date   CD4TABS 510 02/14/2018   CD4TABS 780 05/31/2017  CD4TABS 560 11/21/2016   Lab Results  Component Value Date   HIV1RNAQUANT <20 NOT DETECTED 02/14/2018   Lab Results  Component Value Date   HEPBSAB POS (A) 04/14/2015   Lab Results  Component Value Date   LABRPR NON REAC 05/31/2017    CBC Lab Results  Component Value Date   WBC 3.5 (L) 02/14/2018   RBC 3.98 02/14/2018   HGB 12.9 02/14/2018   HCT 37.7 02/14/2018   PLT 189  02/14/2018   MCV 94.7 02/14/2018   MCH 32.4 02/14/2018   MCHC 34.2 02/14/2018   RDW 11.7 02/14/2018   LYMPHSABS 1,250 02/14/2018   MONOABS 384 05/31/2017   EOSABS 39 02/14/2018    BMET Lab Results  Component Value Date   NA 139 02/14/2018   K 3.9 02/14/2018   CL 104 02/14/2018   CO2 28 02/14/2018   GLUCOSE 91 02/14/2018   BUN 12 02/14/2018   CREATININE 0.73 02/14/2018   CALCIUM 9.2 02/14/2018   GFRNONAA >89 05/31/2017   GFRAA >89 05/31/2017      Assessment and Plan  hiv disease = well controlled. Continue on biktarvy  Anxiety = will increase dose of her SSRI. She has already received a refill of klonopin but will not refill at this time. Again mentioned that klonopin will not be something she should take on daily basis and that is not likely to be represcribed by her mental health providers since they may choose other medication to treat her. In the past, she mentionsed htat she was also on buspar. We will prescribe that for her at low dose to see if it will help her.   Spent 35 min with patient with greater than 50% discussion on anxiety management

## 2018-02-27 NOTE — Patient Instructions (Addendum)
I have increased your paxil to 40mg  daily  Plus started you on buspar 10mg  three times per day. ( start at 1/2 tab three times per day x 7 days prior to taking 1 tab three times per day)  Providers in town include:  [facility] [phone]  [facility] [phone]

## 2018-02-28 ENCOUNTER — Other Ambulatory Visit: Payer: Self-pay | Admitting: Pharmacist

## 2018-02-28 DIAGNOSIS — B20 Human immunodeficiency virus [HIV] disease: Secondary | ICD-10-CM

## 2018-02-28 MED ORDER — BICTEGRAVIR-EMTRICITAB-TENOFOV 50-200-25 MG PO TABS
1.0000 | ORAL_TABLET | Freq: Every day | ORAL | 6 refills | Status: DC
Start: 2018-02-28 — End: 2018-10-18

## 2018-03-14 ENCOUNTER — Other Ambulatory Visit: Payer: Self-pay | Admitting: Pharmacist Clinician (PhC)/ Clinical Pharmacy Specialist

## 2018-03-14 MED ORDER — BICTEGRAVIR-EMTRICITAB-TENOFOV 50-200-25 MG PO TABS: 1.0000 | ORAL_TABLET | Freq: Every day | ORAL | 0 refills | Status: DC

## 2018-03-14 NOTE — Progress Notes (Signed)
Temporary fill until ADAP is approved. [facility]

## 2018-03-22 ENCOUNTER — Telehealth: Payer: Self-pay | Admitting: Behavioral Health

## 2018-03-22 ENCOUNTER — Other Ambulatory Visit: Payer: Self-pay | Admitting: Behavioral Health

## 2018-03-22 DIAGNOSIS — N760 Acute vaginitis: Secondary | ICD-10-CM

## 2018-03-22 MED ORDER — FLUCONAZOLE 100 MG PO TABS: 100.0000 mg | ORAL_TABLET | Freq: Every day | ORAL | 0 refills | Status: AC

## 2018-03-22 NOTE — Progress Notes (Signed)
Reordered per [name]/Tammy please cosign. Angeline SlimAshley Leovanni Bjorkman RN

## 2018-03-22 NOTE — Telephone Encounter (Signed)
Patient called stating she thinks she has a yeast infection after taking Antibiotics prescribed by her Podiatrist after having a bone spur.  Patient states she has had yeast infections in the past after antibiotics and was wondering [facility] [address] Angeline SlimAshley Hill RN

## 2018-06-14 ENCOUNTER — Other Ambulatory Visit: Payer: Self-pay | Admitting: Internal Medicine

## 2018-07-12 ENCOUNTER — Other Ambulatory Visit: Payer: Self-pay | Admitting: Internal Medicine

## 2018-08-10 ENCOUNTER — Other Ambulatory Visit: Payer: Self-pay | Admitting: Internal Medicine

## 2018-08-12 ENCOUNTER — Telehealth: Payer: Self-pay | Admitting: *Deleted
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
## 2018-08-13 ENCOUNTER — Ambulatory Visit: Payer: Self-pay

## 2018-09-09 ENCOUNTER — Other Ambulatory Visit: Payer: Self-pay | Admitting: Internal Medicine

## 2018-09-09 ENCOUNTER — Telehealth: Payer: Self-pay | Admitting: *Deleted

## 2018-09-09 DIAGNOSIS — F411 Generalized anxiety disorder: Secondary | ICD-10-CM

## 2018-09-09 NOTE — Telephone Encounter (Signed)
RN attempted to call patient to schedule an appointment and to renew her adap/ryan white.  Received message "your phone call could not be completed at this time." Andree CossHowell, Donshay Lupinski M, RN

## 2018-09-12 ENCOUNTER — Encounter: Payer: Self-pay | Admitting: Internal Medicine

## 2018-10-17 ENCOUNTER — Other Ambulatory Visit: Payer: Self-pay | Admitting: Internal Medicine

## 2018-10-17 DIAGNOSIS — F411 Generalized anxiety disorder: Secondary | ICD-10-CM

## 2018-10-18 ENCOUNTER — Other Ambulatory Visit: Payer: Self-pay | Admitting: Pharmacist

## 2018-10-18 DIAGNOSIS — B20 Human immunodeficiency virus [HIV] disease: Secondary | ICD-10-CM

## 2018-10-18 MED ORDER — BICTEGRAVIR-EMTRICITAB-TENOFOV 50-200-25 MG PO TABS: 1.0000 | ORAL_TABLET | Freq: Every day | ORAL | 6 refills | Status: DC

## 2019-01-06 ENCOUNTER — Telehealth: Payer: Self-pay

## 2019-01-06 ENCOUNTER — Other Ambulatory Visit: Payer: Self-pay | Admitting: Internal Medicine

## 2019-01-06 NOTE — Telephone Encounter (Signed)
Called patient to schedule an appointment with our office. Patient has not been seen since 02/2018. Spoke with patient who was able to schedule a office visit with Dr. Drue Second; would like to have lab work done same day. Patient will reach out to case manager to renew her ADAP for first half of the year. Lorenso Courier, [place]

## 2019-01-15 ENCOUNTER — Ambulatory Visit (INDEPENDENT_AMBULATORY_CARE_PROVIDER_SITE_OTHER): Payer: Self-pay | Admitting: Internal Medicine

## 2019-01-15 ENCOUNTER — Encounter: Payer: Self-pay | Admitting: Internal Medicine

## 2019-01-15 VITALS — BP 115/78 | HR 78 | Temp 98.1°F | Wt 143.0 lb

## 2019-01-15 DIAGNOSIS — Z79899 Other long term (current) drug therapy: Secondary | ICD-10-CM

## 2019-01-15 DIAGNOSIS — B20 Human immunodeficiency virus [HIV] disease: Secondary | ICD-10-CM

## 2019-01-15 DIAGNOSIS — F329 Major depressive disorder, single episode, unspecified: Secondary | ICD-10-CM

## 2019-01-15 DIAGNOSIS — F410 Panic disorder [episodic paroxysmal anxiety] without agoraphobia: Secondary | ICD-10-CM

## 2019-01-15 DIAGNOSIS — Z91410 Personal history of adult physical and sexual abuse: Secondary | ICD-10-CM

## 2019-01-15 DIAGNOSIS — T7421XS Adult sexual abuse, confirmed, sequela: Secondary | ICD-10-CM

## 2019-01-15 DIAGNOSIS — F419 Anxiety disorder, unspecified: Secondary | ICD-10-CM

## 2019-01-15 DIAGNOSIS — J069 Acute upper respiratory infection, unspecified: Secondary | ICD-10-CM

## 2019-01-15 MED ORDER — PREDNISONE 10 MG PO TABS: 10.0000 mg | ORAL_TABLET | Freq: Every day | ORAL | 0 refills | Status: DC

## 2019-01-15 MED ORDER — TRAZODONE HCL 100 MG PO TABS: 100.0000 mg | ORAL_TABLET | Freq: Every day | ORAL | 11 refills | Status: DC

## 2019-01-15 MED ORDER — BUSPIRONE HCL 15 MG PO TABS: 15.0000 mg | ORAL_TABLET | Freq: Two times a day (BID) | ORAL | 11 refills | Status: DC

## 2019-01-15 MED ORDER — GUAIFENESIN-CODEINE 100-10 MG/5ML PO SOLN: 5.0000 mL | Freq: Four times a day (QID) | ORAL | 0 refills | Status: DC | PRN
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
## 2019-01-16 LAB — T-HELPER CELL (CD4) - (RCID CLINIC ONLY)
CD4 % Helper T Cell: 34 % (ref 33–55)
CD4 T Cell Abs: 460 /uL (ref 400–2700)

## 2019-01-16 LAB — URINE CYTOLOGY ANCILLARY ONLY
CHLAMYDIA, DNA PROBE: NEGATIVE
Neisseria Gonorrhea: NEGATIVE

## 2019-01-17 LAB — COMPLETE METABOLIC PANEL WITH GFR
AG Ratio: 1.8 (calc) (ref 1.0–2.5)
ALT: 9 U/L (ref 6–29)
AST: 10 U/L (ref 10–30)
Albumin: 4.5 g/dL (ref 3.6–5.1)
Alkaline phosphatase (APISO): 42 U/L (ref 33–115)
BUN: 14 mg/dL (ref 7–25)
CHLORIDE: 104 mmol/L (ref 98–110)
CO2: 29 mmol/L (ref 20–32)
Calcium: 10 mg/dL (ref 8.6–10.2)
Creat: 0.79 mg/dL (ref 0.50–1.10)
GFR, Est African American: 112 mL/min/{1.73_m2} (ref >=60)
GFR, Est Non African American: 96 mL/min/{1.73_m2} (ref >=60)
GLUCOSE: 94 mg/dL (ref 65–99)
Globulin: 2.5 g/dL (calc) (ref 1.9–3.7)
Potassium: 4.8 mmol/L (ref 3.5–5.3)
Sodium: 138 mmol/L (ref 135–146)
Total Bilirubin: 0.3 mg/dL (ref 0.2–1.2)
Total Protein: 7 g/dL (ref 6.1–8.1)

## 2019-01-17 LAB — CBC WITH DIFFERENTIAL/PLATELET
Absolute Monocytes: 450 cells/uL (ref 200–950)
BASOS ABS: 90 {cells}/uL (ref 0–200)
Basophils Relative: 1.5 %
EOS PCT: 1.3 %
Eosinophils Absolute: 78 cells/uL (ref 15–500)
HCT: 40 % (ref 35.0–45.0)
Hemoglobin: 13.5 g/dL (ref 11.7–15.5)
Lymphs Abs: 1284 cells/uL (ref 850–3900)
MCH: 31.9 pg (ref 27.0–33.0)
MCHC: 33.8 g/dL (ref 32.0–36.0)
MCV: 94.6 fL (ref 80.0–100.0)
MONOS PCT: 7.5 %
MPV: 10.7 fL (ref 7.5–12.5)
NEUTROS ABS: 4098 {cells}/uL (ref 1500–7800)
NEUTROS PCT: 68.3 %
Platelets: 248 10*3/uL (ref 140–400)
RBC: 4.23 10*6/uL (ref 3.80–5.10)
RDW: 13 % (ref 11.0–15.0)
Total Lymphocyte: 21.4 %
WBC: 6 10*3/uL (ref 3.8–10.8)

## 2019-01-17 LAB — HIV-1 RNA QUANT-NO REFLEX-BLD
HIV 1 RNA QUANT: NOT DETECTED {copies}/mL
HIV-1 RNA Quant, Log: <1.3 Log copies/mL

## 2019-01-17 LAB — RPR: RPR Ser Ql: NONREACTIVE

## 2019-01-20 ENCOUNTER — Ambulatory Visit: Payer: Self-pay | Admitting: Licensed Clinical Social Worker

## 2019-02-10 ENCOUNTER — Other Ambulatory Visit: Payer: Self-pay

## 2019-02-10 ENCOUNTER — Emergency Department (HOSPITAL_COMMUNITY)
Admission: EM | Admit: 2019-02-10 | Discharge: 2019-02-10 | Discharge status: Against Medical Advice | Payer: Self-pay | Attending: Emergency Medicine | Admitting: Emergency Medicine

## 2019-02-10 ENCOUNTER — Encounter (HOSPITAL_COMMUNITY): Payer: Self-pay | Admitting: Emergency Medicine

## 2019-02-10 ENCOUNTER — Emergency Department (HOSPITAL_COMMUNITY): Payer: Self-pay

## 2019-02-10 DIAGNOSIS — F17210 Nicotine dependence, cigarettes, uncomplicated: Secondary | ICD-10-CM | POA: Insufficient documentation

## 2019-02-10 DIAGNOSIS — H00034 Abscess of left upper eyelid: Secondary | ICD-10-CM | POA: Insufficient documentation

## 2019-02-10 DIAGNOSIS — Z79899 Other long term (current) drug therapy: Secondary | ICD-10-CM | POA: Insufficient documentation

## 2019-02-10 DIAGNOSIS — Z21 Asymptomatic human immunodeficiency virus [HIV] infection status: Secondary | ICD-10-CM | POA: Insufficient documentation

## 2019-02-10 MED ORDER — VANCOMYCIN HCL IN DEXTROSE 1-5 GM/200ML-% IV SOLN: 1000.0000 mg | Freq: Once | INTRAVENOUS | Status: DC

## 2019-02-10 MED ORDER — SODIUM CHLORIDE 0.9 % IV SOLN: 1.0000 g | Freq: Once | INTRAVENOUS | Status: DC

## 2019-02-10 NOTE — ED Notes (Signed)
Pt came out of room stating "im leaving! I dont have time for any of this.!" pt left department

## 2019-02-10 NOTE — ED Provider Notes (Signed)
[facility]GENCY DEPARTMENT Provider Note   CSN: [id number] Arrival date & time: 02/10/19  0520  Time seen 5:57 AM   History   Chief Complaint Chief Complaint  Patient presents with  . Eye Problem    HPI [name] Colborn is a 38 y.o. female.  HPI patient states a couple days ago she had a "bump" on her left upper eyelid, I asked her what look like she agreed it may have looked like a pimple.  She states it was tender to touch.  It has gotten more swollen and painful over the last 8 hours.  She denies any fever or chills.  She states she has had an abscess once before on her buttocks in 2016.  She denies wearing contacts.  She states this morning her eyelids were swollen shut and she had to pry them apart and she had a lot of drainage.  She complains of a headache.  She has been using her mother's erythromycin ointment in her eye for the last 2 days.  PCP Judyann MunsonSnider, Cynthia, MD   Past Medical History:  Diagnosis Date  . Anxiety   . Depression   . Fibromyalgia 04/29/2015  . GAD (generalized anxiety disorder) 04/29/2015  . History of hepatitis C 04/29/2015  . 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 History of intravenous drug use in remission   . HIV disease (HCC) 04/29/2015  . Insomnia 09/15/2015  . Irregular menses 04/29/2015  . Left leg pain 09/15/2015  . Neuromuscular disorder (HCC)   . Pain of left calf 10/06/2015  . PTSD (post-traumatic stress disorder) 04/29/2015  . Reduced libido 09/15/2015  . Smoker 04/29/2015  . Tremor 10/06/2015  . Vaginitis and vulvovaginitis 04/29/2015  . Yeast infection 09/15/2015    Patient Active Problem List   Diagnosis Date Noted  . Acute frontal sinusitis 11/21/2016  . Post-operative state 10/05/2016  . Tremor 10/06/2015  . Reduced libido 09/15/2015  . Insomnia 09/15/2015  . 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 Hepatitis C antibody test positive 04/29/2015  . HIV disease (HCC) 04/29/2015  . PTSD  (post-traumatic stress disorder) 04/29/2015  . Irregular menses 04/29/2015  . GAD (generalized anxiety disorder) 04/29/2015  . 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 Smoker 04/29/2015  . Fibromyalgia 04/29/2015  . Breast pain, left 03/31/2015    Past Surgical History:  Procedure Laterality Date  . DILATION AND EVACUATION N/A 09/16/2016   Procedure: DILATATION AND EVACUATION;  Surgeon: Hermina StaggersMichael L Ervin, MD;  Location: [facility];  Service: Gynecology;  Laterality: N/A;     OB History    Gravida  4   Para  0   Term      Preterm      AB  3   Living  1     SAB  1   TAB  2   Ectopic      Multiple      Live Births  1            Home Medications    Prior to Admission medications   Medication Sig Start Date End Date Taking? Authorizing Provider  albuterol (PROVENTIL HFA;VENTOLIN HFA) 108 (90 Base) MCG/ACT inhaler Inhale 2 puffs into the lungs every 6 (six) hours as needed for wheezing or shortness of breath. 10/01/17   Judyann MunsonSnider, Cynthia, MD  bictegravir-emtricitabine-tenofovir AF (BIKTARVY) 50-200-25 MG TABS tablet Take 1 tablet by mouth daily. 10/18/18   Judyann MunsonSnider, Cynthia, MD  busPIRone (BUSPAR) 15  MG tablet Take 1 tablet (15 mg total) by mouth 2 (two) times daily. 01/15/19   [name], MD  clonazePAM (KLONOPIN) 1 MG tablet Take 1 tablet (1 mg total) by mouth 2 (two) times daily as needed for anxiety (Patient [facility] to [facility] with a Psychiatrist on January 29, 2018.  Psychiatrist to order additional refills.)). Patient not taking: Reported on 01/15/2019 01/07/18   [name], MD  guaiFENesin-codeine 100-10 MG/5ML syrup Take 5 mLs by mouth every 6 (six) hours as needed for cough. 01/15/19   [name], MD  ibuprofen (ADVIL,MOTRIN) 800 MG tablet Take 1 tablet (800 mg total) by mouth every 8 (eight) hours as needed. 09/16/16   Hermina Staggers, MD  PARoxetine (PAXIL) 40 MG tablet Take 1 tablet (40 mg total) by mouth every morning. 02/27/18    [name], MD  predniSONE (DELTASONE) 10 MG tablet Take 1 tablet (10 mg total) by mouth daily with breakfast. 01/15/19   [name], MD  traZODone (DESYREL) 100 MG tablet Take 1 tablet (100 mg total) by mouth at bedtime. 01/15/19   [name], MD    Family History Family History  Problem Relation Age of Onset  . Fibromyalgia Mother   . Sudden death Maternal Grandfather     Social History Social History   Tobacco Use  . Smoking status: Current Every Day Smoker    Packs/day: 1.00    Years: 16.00    Pack years: 16.00    Types: Cigarettes  . Smokeless tobacco: Never Used  Substance Use Topics  . Alcohol use: Yes    Alcohol/week: 1.0 standard drinks    Types: 1 Glasses of wine per week    Comment: occasional  . Drug use: Yes    Types: Marijuana    Comment: history of polysubstance drug use      Allergies   Asa [aspirin] and Rilpivirine   Review of Systems Review of Systems  All other systems reviewed and are negative.    Physical Exam Updated Vital Signs BP 124/86 (BP Location: Right Arm)   Pulse 95   Temp 97.6 F (36.4 C) (Oral)   Resp 18   Ht 5\' 4"  (1.626 m)   Wt 63.5 kg   LMP 02/06/2019   SpO2 99%   BMI 24.03 kg/m   Vital signs normal    Physical Exam Vitals signs and nursing note reviewed.  Constitutional:      General: She is in acute distress.     Comments: Underweight  HENT:     Head: Normocephalic and atraumatic.     Nose: Nose normal.  Eyes:     Extraocular Movements: Extraocular movements intact.     Conjunctiva/sclera: Conjunctivae normal.     Pupils: Pupils are equal, round, and reactive to light.      Comments: Patient has redness and swelling and a distinct lump of her mid upper left eyelid.  She has some crustiness on her eyelids.  Her conjunctiva appear normal.  She states it hurts when she moves her eyes through range of motion.  She is very "flinching", she has a hard time holding still.  She appears to have some  photophobia.  Neck:     Musculoskeletal: Normal range of motion.  Cardiovascular:     Rate and Rhythm: Normal rate.  Pulmonary:     Effort: Pulmonary effort is normal. No respiratory distress.  Musculoskeletal: Normal range of motion.  Skin:    General: Skin is warm and dry.  Capillary Refill: Capillary refill takes less than 2 seconds.  Neurological:     General: No focal deficit present.     Mental Status: She is alert and oriented to person, place, and time.     Cranial Nerves: No cranial nerve deficit.  Psychiatric:        Mood and Affect: Mood is anxious. Affect is labile.        Speech: Speech is rapid and pressured.        Behavior: Behavior is hyperactive.      ED Treatments / Results  Labs (all labs ordered are listed, but only abnormal results are displayed) Labs Reviewed - No data to display  EKG None  Radiology No results found.  Procedures Procedures (including critical care time)  Medications Ordered in ED Medications - No data to display   Initial Impression / Assessment and Plan / ED Course  I have reviewed the triage vital signs and the nursing notes.  Pertinent labs & imaging results that were available during my care of the patient were reviewed by me and considered in my medical decision making (see chart for details).     I talked to the patient about changing her antibiotics both orally and for the eye and using heat.  At this point patient does not sit still and I am not going to do an I&D of this presumed abscess on her eyelid.  It is not in the position of a stye.  She also then was questioning about yeast infection with the antibiotics.  When I thought about it I thought she probably needed more studies done to look for orbital cellulitis because of the photophobia and headache and pain on range of motion of her eyes however she absolutely refuses to get a CT scan.  Had come out of her room and was thinking about what antibiotics to put her  on and decided I would put a 18-gauge needle in the area to help it start draining however the nurses report she had already walked out of the room.  Final Clinical Impressions(s) / ED Diagnoses   Final diagnoses:  Abscess of left upper eyelid    Pt left AMA  Devoria Albe, MD, Concha Pyo, MD 02/10/19 [phone]

## 2019-02-10 NOTE — ED Triage Notes (Signed)
Cyst on lt eyelid x 2 days.  Swollen red and painful.

## 2019-02-11 ENCOUNTER — Other Ambulatory Visit: Payer: Self-pay | Admitting: Internal Medicine

## 2019-02-26 ENCOUNTER — Ambulatory Visit: Payer: Self-pay | Admitting: Licensed Clinical Social Worker

## 2019-02-26 ENCOUNTER — Ambulatory Visit: Payer: Self-pay | Admitting: Internal Medicine

## 2019-03-03 ENCOUNTER — Ambulatory Visit (INDEPENDENT_AMBULATORY_CARE_PROVIDER_SITE_OTHER): Payer: Self-pay | Admitting: Internal Medicine

## 2019-03-03 ENCOUNTER — Other Ambulatory Visit: Payer: Self-pay

## 2019-03-03 ENCOUNTER — Encounter: Payer: Self-pay | Admitting: Internal Medicine

## 2019-03-03 VITALS — BP 127/70 | HR 90 | Temp 99.0°F | Wt 150.0 lb

## 2019-03-03 DIAGNOSIS — F329 Major depressive disorder, single episode, unspecified: Secondary | ICD-10-CM

## 2019-03-03 DIAGNOSIS — B20 Human immunodeficiency virus [HIV] disease: Secondary | ICD-10-CM

## 2019-03-03 DIAGNOSIS — F419 Anxiety disorder, unspecified: Secondary | ICD-10-CM

## 2019-03-03 MED ORDER — ESCITALOPRAM OXALATE 5 MG PO TABS: 5.0000 mg | ORAL_TABLET | Freq: Every day | ORAL | 11 refills | Status: DC

## 2019-03-03 NOTE — Progress Notes (Signed)
Patient ID: Samantha Fleming, female   DOB: [birth year], 38 y.o.   MRN: [id number]  HPI Samantha Fleming is a 38yo F with hiv disease, CD 4 count of 460/VL<20 in January on biktarvy. She reports that she  stopped paxil due to tremors and did not tolerate it. Wondering if she could try another agent. She is also wanting to start back on birthcontrol pill, wants to avoid weight gain with depot  Outpatient Encounter Medications as of 03/03/2019  Medication Sig  . albuterol (PROVENTIL HFA;VENTOLIN HFA) 108 (90 Base) MCG/ACT inhaler Inhale 2 puffs into the lungs every 6 (six) hours as needed for wheezing or shortness of breath.  . bictegravir-emtricitabine-tenofovir AF (BIKTARVY) 50-200-25 MG TABS tablet Take 1 tablet by mouth daily.  . busPIRone (BUSPAR) 15 MG tablet Take 1 tablet (15 mg total) by mouth 2 (two) times daily.  . clonazePAM (KLONOPIN) 1 MG tablet Take 1 tablet (1 mg total) by mouth 2 (two) times daily as needed for anxiety (Patient needs to [facility] appointment with a Psychiatrist on January 29, 2018.  Psychiatrist to order additional refills.)).  Marland Kitchen guaiFENesin-codeine 100-10 MG/5ML syrup Take 5 mLs by mouth every 6 (six) hours as needed for cough.  Marland Kitchen ibuprofen (ADVIL,MOTRIN) 800 MG tablet Take 1 tablet (800 mg total) by mouth every 8 (eight) hours as needed.  . predniSONE (DELTASONE) 10 MG tablet Take 1 tablet (10 mg total) by mouth daily with breakfast.  . traZODone (DESYREL) 100 MG tablet Take 1 tablet (100 mg total) by mouth at bedtime.  Marland Kitchen PARoxetine (PAXIL) 40 MG tablet TAKE 1 TABLET(40 MG) BY MOUTH EVERY MORNING (Patient not taking: Reported on 03/03/2019)   No facility-administered encounter medications on file as of 03/03/2019.      Patient Active Problem List   Diagnosis Date Noted  . Acute frontal sinusitis 11/21/2016  . Post-operative state 10/05/2016  . Tremor 10/06/2015  . Reduced libido 09/15/2015  . Insomnia 09/15/2015  . 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 Hepatitis C antibody test positive 04/29/2015  . HIV disease (HCC) 04/29/2015  . PTSD (post-traumatic stress disorder) 04/29/2015  . Irregular menses 04/29/2015  . GAD (generalized anxiety disorder) 04/29/2015  . 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 Smoker 04/29/2015  . Fibromyalgia 04/29/2015  . Breast pain, left 03/31/2015     Health Maintenance Due  Topic Date Due  . TETANUS/TDAP  09/13/2001  . PAP SMEAR-Modifier  03/22/2018  . INFLUENZA VACCINE  07/25/2018     Review of Systems Review of Systems  Constitutional: Negative for fever, chills, diaphoresis, activity change, appetite change, fatigue and unexpected weight change.  HENT: Negative for congestion, sore throat, rhinorrhea, sneezing, trouble swallowing and sinus pressure.  Eyes: Negative for photophobia and visual disturbance.  Respiratory: Negative for cough, chest tightness, shortness of breath, wheezing and stridor.  Cardiovascular: Negative for chest pain, palpitations and leg swelling.  Gastrointestinal: Negative for nausea, vomiting, abdominal pain, diarrhea, constipation, blood in stool, abdominal distention and anal bleeding.  Genitourinary: Negative for dysuria, hematuria, flank pain and difficulty urinating.  Musculoskeletal: Negative for myalgias, back pain, joint swelling, arthralgias and gait problem.  Skin: Negative for color change, pallor, rash and wound.  Neurological: Negative for dizziness, tremors, weakness and light-headedness.  Hematological: Negative for adenopathy. Does not bruise/bleed easily.  Psychiatric/Behavioral: Negative for behavioral problems, confusion, sleep disturbance, dysphoric mood, decreased concentration and agitation.    Physical Exam   BP 127/70   Pulse 90   Temp  99 F (37.2 C) (Oral)   Wt 150 lb (68 kg)   LMP 02/06/2019   BMI 25.75 kg/m   Physical Exam  Constitutional:  oriented to person, place, and time. appears well-developed and  well-nourished. No distress.  HENT: [name], PERRLA, no scleral icterus Mouth/Throat: Oropharynx is clear and moist. No oropharyngeal exudate.  Cardiovascular: Normal rate, regular rhythm and normal heart sounds. Exam reveals no gallop and no friction rub.  No murmur heard.  Pulmonary/Chest: Effort normal and breath sounds normal. No respiratory distress.  has no wheezes.  Neck = supple, no nuchal rigidity Abdominal: Soft. Bowel sounds are normal.  exhibits no distension. There is no tenderness.  Lymphadenopathy: no cervical adenopathy. No axillary adenopathy Neurological: alert and oriented to person, place, and time.  Skin: Skin is warm and dry. No rash noted. No erythema.  Psychiatric: a normal mood and affect.  behavior is normal.   Lab Results  Component Value Date   CD4TCELL 34 01/15/2019   Lab Results  Component Value Date   CD4TABS 460 01/15/2019   CD4TABS 510 02/14/2018   CD4TABS 780 05/31/2017   Lab Results  Component Value Date   HIV1RNAQUANT <20 NOT DETECTED 01/15/2019   Lab Results  Component Value Date   HEPBSAB POS (A) 04/14/2015   Lab Results  Component Value Date   LABRPR NON-REACTIVE 01/15/2019    CBC Lab Results  Component Value Date   WBC 6.0 01/15/2019   RBC 4.23 01/15/2019   HGB 13.5 01/15/2019   HCT 40.0 01/15/2019   PLT 248 01/15/2019   MCV 94.6 01/15/2019   MCH 31.9 01/15/2019   MCHC 33.8 01/15/2019   RDW 13.0 01/15/2019   LYMPHSABS 1,284 01/15/2019   MONOABS 384 05/31/2017   EOSABS 78 01/15/2019    BMET Lab Results  Component Value Date   NA 138 01/15/2019   K 4.8 01/15/2019   CL 104 01/15/2019   CO2 29 01/15/2019   GLUCOSE 94 01/15/2019   BUN 14 01/15/2019   CREATININE 0.79 01/15/2019   CALCIUM 10.0 01/15/2019   GFRNONAA 96 01/15/2019   GFRAA 112 01/15/2019      Assessment and Plan  hiv disease= plan on continuing biktarvy  Anxiety = plan to change off of paxil and see if other agent will have same side  effect

## 2019-03-10 ENCOUNTER — Telehealth: Payer: Self-pay

## 2019-03-10 NOTE — Telephone Encounter (Signed)
Patient called office today requesting update on birthcontrol that was discussed in last visit. Patient states that Dr. Drue Second was supposed to speak with OBGYN regarding birth control options. Will route message to Dr. Drue Second to advise on birthcontrol. Samantha Fleming, [place]

## 2019-03-17 ENCOUNTER — Telehealth: Payer: Self-pay | Admitting: *Deleted

## 2019-03-17 NOTE — Telephone Encounter (Signed)
Patient called the office to check on the rx for birth control she discussed with Drue Second at her last appointment. Advised will send a message to the provider which may take a while due to the Covid 19 situation but we will call her back once she does respond.  Offered the possibility of Depo and she does not want that.

## 2019-04-22 ENCOUNTER — Telehealth: Payer: Self-pay | Admitting: *Deleted

## 2019-04-22 NOTE — Telephone Encounter (Signed)
She would like to have her tubes tied, has not had that procedure.

## 2019-04-22 NOTE — Telephone Encounter (Signed)
Patient is calling to see if she can have follow up on her request for birth control. She states the birthcontrol she previously took in [place] made her feet swell and caused neuropathy (perhaps Nordette or Cyclafem?). She would like to know if there is any connection for her to have her tubes tied. She is overdue for a pap smear, would like to come in 5/5 at 1:45.   She also reports nausea with vomiting overnight. She will try small amounts of water/broth/gatorade or pedialyte today to see if she is able to keep down fluids. She will call if she is unable to tolerate this. Andree Coss, RN

## 2019-04-22 NOTE — Telephone Encounter (Signed)
Would like for her to either let us check a pregnancy test of have her do one in the home. I am not sure if she has had her tubes tied already or wants to.   We can talk about [facility] options at upcoming visit next week.

## 2019-04-23 ENCOUNTER — Other Ambulatory Visit: Payer: Self-pay | Admitting: Internal Medicine

## 2019-04-23 DIAGNOSIS — B20 Human immunodeficiency virus [HIV] disease: Secondary | ICD-10-CM

## 2019-04-23 NOTE — Telephone Encounter (Signed)
Left message asking how Samantha Fleming is feeling today, if her nausea has resolved, if she decided to take a home pregnancy test. RN let her know that [name] will discuss contraceptive options at her PAP appointment.  RN asked Samantha Fleming to return my call, let us know how she is doing.

## 2019-04-29 ENCOUNTER — Ambulatory Visit: Payer: Self-pay | Admitting: Infectious Diseases

## 2019-05-20 ENCOUNTER — Encounter: Payer: Self-pay | Admitting: Infectious Diseases

## 2019-05-20 ENCOUNTER — Ambulatory Visit (INDEPENDENT_AMBULATORY_CARE_PROVIDER_SITE_OTHER): Payer: Self-pay | Admitting: Infectious Diseases

## 2019-05-20 ENCOUNTER — Encounter: Payer: Self-pay | Admitting: Internal Medicine

## 2019-05-20 ENCOUNTER — Other Ambulatory Visit: Payer: Self-pay

## 2019-05-20 VITALS — BP 105/71 | HR 84 | Temp 98.4°F | Wt 145.0 lb

## 2019-05-20 DIAGNOSIS — Z124 Encounter for screening for malignant neoplasm of cervix: Secondary | ICD-10-CM

## 2019-05-20 DIAGNOSIS — Z113 Encounter for screening for infections with a predominantly sexual mode of transmission: Secondary | ICD-10-CM

## 2019-05-20 DIAGNOSIS — N750 Cyst of Bartholin's gland: Secondary | ICD-10-CM

## 2019-05-20 DIAGNOSIS — N921 Excessive and frequent menstruation with irregular cycle: Secondary | ICD-10-CM

## 2019-05-20 DIAGNOSIS — B20 Human immunodeficiency virus [HIV] disease: Secondary | ICD-10-CM

## 2019-05-20 DIAGNOSIS — N898 Other specified noninflammatory disorders of vagina: Secondary | ICD-10-CM

## 2019-05-20 DIAGNOSIS — Z3009 Encounter for other general counseling and advice on contraception: Secondary | ICD-10-CM

## 2019-05-20 MED ORDER — FLUCONAZOLE 150 MG PO TABS: 150.0000 mg | ORAL_TABLET | Freq: Once | ORAL | 0 refills | Status: AC

## 2019-05-20 MED ORDER — DOXYCYCLINE HYCLATE 100 MG PO TABS: 100.0000 mg | ORAL_TABLET | Freq: Two times a day (BID) | ORAL | 0 refills | Status: AC

## 2019-05-20 MED ORDER — NORGESTIM-ETH ESTRAD TRIPHASIC 0.18/0.215/0.25 MG-25 MCG PO TABS: 1.0000 | ORAL_TABLET | Freq: Every day | ORAL | 2 refills | Status: DC

## 2019-05-20 NOTE — Assessment & Plan Note (Signed)
She desires no further children.  We spent a majority of visit discussing options for birth control as well as other symptoms that she is experiencing mostly heavy menstruation with irregular cycles.  She is a current smoker but smokes less than 15 cigarettes/day.  No other contraindications for combined oral contraceptives.  We will try Tri-Sprintec.  We discussed various methods to start contraception.  I believe she will do same-day start option.  Encouraged her to use condoms for the next week after she starts for reliable birth control.  Again counseled that birth control pills do not prevent against STDs.  Screen today given vaginal discharge

## 2019-05-20 NOTE — Progress Notes (Signed)
Subjective:    Samantha Fleming is a 38 y.o. female here for an annual pelvic exam and pap smear. She would like to discuss birth control options for her as well as heavy menstrual bleeding, a "knot" on the inside of her vagina that is very painful and easy bruising. She has no history of abnormal pap smears.   She first noticed the knot develop about 5 days ago. It has been painful for her to maintain certain positions and unable to have intercourse. No drainage from this area but does report an increase in vaginal discharge described to be white and creamy. No itching. Has a hx of PID.   Has had several miscarriages and elective abortions that she has required further emergency D&C for d/t ongoing bleeding in the past. Self reported history of endometriosis (was going to have laparoscopy but never did due to achieving pregnancy 6 years ago). She does continue to have irregular (more frequent cycles) and heavy menstrual bleeding with cramping.   Using only condoms now for birth control. Has been on oral contraceptives in the past but has had some trouble with "neuropathy and lower extremity swelling" and stopped taking them. Nordette was the birthcontrol she was using in the past per our records. She would not be interested in anything aside from a pill. She denies any personal or family history of DVT/PE, migraines and smokes < 15 cigarettes a day.   Review of Systems: See HPI for details.   Past Medical History:  Diagnosis Date  . Anxiety   . Depression   . Fibromyalgia 04/29/2015  . GAD (generalized anxiety disorder) 04/29/2015  . History of hepatitis C 04/29/2015  . 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 History of intravenous drug use in remission   . HIV disease (HCC) 04/29/2015  . Insomnia 09/15/2015  . Irregular menses 04/29/2015  . Left leg pain 09/15/2015  . Neuromuscular disorder (HCC)   . Pain of left calf 10/06/2015  . PTSD (post-traumatic stress disorder)  04/29/2015  . Reduced libido 09/15/2015  . Smoker 04/29/2015  . Tremor 10/06/2015  . Vaginitis and vulvovaginitis 04/29/2015  . Yeast infection 09/15/2015    Gynecologic History: [id number]  Patient's last menstrual period was 04/29/2019 (approximate). Contraception: condoms Last Pap: 03/23/2015. Results were: normal Anal Intercourse: non Last Mammogram: n/a   Objective:  Physical Exam  Constitutional: Well developed, well nourished, no acute distress. She is alert and oriented x3.  Pelvic: External genitalia is normal in appearance with exception of an approximately 2cm sized area at the 4-5 o'clock position can be palpated. This is tender to touch and firm with mild erythema. The vagina is normal in appearance. The cervix is bulbous and easily visualized. No CMT or signs of cervicitis, increased white creamy vaginal discharge.   Unable to tolerate bimanual exam after speculum exam.  Breasts: deferred exam today  Psych: She is anxious     Assessment & Plan:    Problem List Items Addressed This Visit      Unprioritized   Contraceptive education    She desires no further children.  We spent a majority of visit discussing options for birth control as well as other symptoms that she is experiencing mostly heavy menstruation with irregular cycles.  She is a current smoker but smokes less than 15 cigarettes/day.  No other contraindications for combined oral contraceptives.  We will try Tri-Sprintec.  We discussed various methods to start contraception.  I believe  she will do same-day start option.  Encouraged her to use condoms for the next week after she starts for reliable birth control.  Again counseled that birth control pills do not prevent against STDs.  Screen today given vaginal discharge      Cyst of Bartholin's gland duct - Primary    She has a deep firm tender nodule about 2 cm in diameter at the 4 to 5 o'clock position on her left labia.  Concerning for Bartholin's gland cyst versus  abscess.  Given acute onset and significant pain will prescribe doxycycline 100 mg twice daily x10 days today.  Warm compresses also encouraged.  We will increase the priority referral if she does not have improvement over the next 2 to 3 days on antibiotic to GYN for consideration of drainage.  Cautions given that would warrant ER visit.      Relevant Orders   Ambulatory referral to Obstetrics / Gynecology   HIV disease [facility](HCC)    She has an upcoming appointment for routine care maintenance regarding HIV with Dr. Ilsa IhaSnyder.  She has been under good control.      Relevant Medications   fluconazole (DIFLUCAN) 150 MG tablet   Menorrhagia with irregular cycle    Most recent CBC without any anemia.  Platelet count is normal.  Considering her complaint of associated easy bruising and several miscarriages and complicated elective abortions requiring further D&C will refer to GYN to ensure no coagulopathy is to explain.  It sounds like she has been partially worked up for symptoms consistent with endometriosis in the past. We will start her on triphasic OCP since it sounds like she was not able to tolerate previous monophasic regimen.       Screening for cervical cancer    Wet prep, GC/C and cytology with reflex HPV obtained today.  Discussed recommended screening interval for women living with HIV disease. Will continue annual screenings for now with consideration to increase interval to q612yr pending further discussions Results will be communicated to the patient via phone call      Relevant Orders   Cytology - PAP( [facility])   Vaginal discharge    No CMT or signs of cervicitis.  Likely bacterial vaginosis.  She has a history of vaginal candidiasis.  We will give her Diflucan with repeat x1 given antibiotic need.  Further intervention to follow with testing done today with wet prep and gonorrhea chlamydia       Other Visit Diagnoses    Routine screening for STI (sexually transmitted infection)        Relevant Orders   GC/Chlamydia Probe Amp   WET PREP FOR TRICH, YEAST, CLUE      Rexene AlbertsStephanie Charlis Harner, MSN, NP-C [facility] Office: [phone] Pager: [phone]  05/20/19 3:28 PM

## 2019-05-20 NOTE — Patient Instructions (Addendum)
I suspect you have a bacterial infection which is causing your knot. Please see instructions below for further details about management for this. Please take your doxycycline twice a day for 10 days. If this is not improving after 2-3 days will increase the urgency of your referral to Gynecology for evaluation. If this progresses more to where the pain is very severe or you have fevers/chills would encourage you to go to the emergency room for more urgent evaluation.   Will call you with results of the tests we did today.   For your birth control - you can start this same day as you get it or choose to wait until the first Sunday after your menstrual period stops. Please continue to use condoms for 7-10 days while you get established on your new medication. Try not to miss pills - if you do this will decrease the effectiveness of the pregnancy prevention.   If you have any side effects that are concerning please give us a call or send a MyChart   Please keep your appointment with Dr. Drue SecondSnider coming up in early June so we can follow how you are doing on new birth control and the cyst.    Bartholin's Cyst or Abscess  A Bartholin's cyst is a fluid-filled sac that forms on a Bartholin's gland. Bartholin's glands are small glands in the folds of skin around the opening of the vagina (labia). This type of cyst causes a bulge or lump near the lower opening of the vagina. If you have a cyst that is small and not infected, you may be able to take care of it at home. If your cyst gets infected, it may cause pain and your doctor may need to drain it. An infected Bartholin's cyst is called a Bartholin's abscess. Follow these instructions at home: Medicines  Take over-the-counter and prescription medicines only as told by your doctor.  If you were prescribed an antibiotic medicine, take it as told by your doctor. Do not stop taking the antibiotic even if you start to feel better. Managing pain and swelling   Try sitz baths to help with pain and swelling. A sitz bath is a warm water bath in which the water only comes up to your hips and should cover your buttocks. You may take sitz baths a few times a day.  Put heat on the affected area as often as needed. Use the heat source that your doctor recommends, such as a moist heat pack or a heating pad. ? Place a towel between your skin and the heat source. ? Leave the heat on for 20-30 minutes. ? Remove the heat if your skin turns bright red. This is especially important if you cannot feel pain, heat, or cold. You may have a greater risk of getting burned. General instructions  If your cyst or abscess was drained: ? Follow instructions from your doctor about how to take care of your wound. ? Use feminine pads to absorb any fluid.  Do not push on or squeeze your cyst.  Do not have sex until the cyst has gone away or your wound from drainage has healed.  Take these steps to help prevent a Bartholin's cyst from returning, and to prevent other Bartholin's cysts from forming: ? Take a bath or shower once a day. Clean your vaginal area with mild soap and water when you bathe. ? Practice safe sex to prevent STIs (sexually transmitted infections). Talk with your doctor about how to prevent STIs  and which forms of birth control (contraception) may be best for you.  Keep all follow-up visits as told by your doctor. This is important. Contact a doctor if:  You have a fever.  You get redness, swelling, or pain around your cyst.  You have fluid, blood, pus, or a bad smell coming from your cyst.  You have a cyst that gets larger or comes back. Summary  A Bartholin's cyst is a fluid-filled sac that forms on a Bartholin's gland. These small glands are found in the folds of skin around the opening of the vagina (labia).  This type of cyst causes a bulge or lump near the lower opening of the vagina. An infected Bartholin's cyst is called a Bartholin's abscess.   Try sitz baths a few times a day to help with pain and swelling.  Do not push on or squeeze your cyst. This information is not intended to replace advice given to you by your health care provider. Make sure you discuss any questions you have with your health care provider. Document Released: 03/09/2009 Document Revised: 09/12/2017 Document Reviewed: 09/12/2017 Elsevier Interactive Patient Education  2019 [employer].

## 2019-05-20 NOTE — Assessment & Plan Note (Signed)
Most recent CBC without any anemia.  Platelet count is normal.  Considering her complaint of associated easy bruising and several miscarriages and complicated elective abortions requiring further D&C will refer to GYN to ensure no coagulopathy is to explain.  It sounds like she has been partially worked up for symptoms consistent with endometriosis in the past. We will start her on triphasic OCP since it sounds like she was not able to tolerate previous monophasic regimen.

## 2019-05-20 NOTE — Assessment & Plan Note (Signed)
Wet prep, GC/C and cytology with reflex HPV obtained today.  Discussed recommended screening interval for women living with HIV disease. Will continue annual screenings for now with consideration to increase interval to q29yr pending further discussions Results will be communicated to the patient via phone call

## 2019-05-20 NOTE — Assessment & Plan Note (Addendum)
No CMT or signs of cervicitis.  Likely bacterial vaginosis.  She has a history of vaginal candidiasis.  We will give her Diflucan with repeat x1 given antibiotic need.  Further intervention to follow with testing done today with wet prep and gonorrhea chlamydia

## 2019-05-20 NOTE — Assessment & Plan Note (Signed)
She has a deep firm tender nodule about 2 cm in diameter at the 4 to 5 o'clock position on her left labia.  Concerning for Bartholin's gland cyst versus abscess.  Given acute onset and significant pain will prescribe doxycycline 100 mg twice daily x10 days today.  Warm compresses also encouraged.  We will increase the priority referral if she does not have improvement over the next 2 to 3 days on antibiotic to GYN for consideration of drainage.  Cautions given that would warrant ER visit.

## 2019-05-20 NOTE — Assessment & Plan Note (Signed)
She has an upcoming appointment for routine care maintenance regarding HIV with Dr. Ilsa Iha.  She has been under good control.

## 2019-05-21 NOTE — Addendum Note (Signed)
Addended by: Lorenso Courier on: 05/21/2019 10:13 AM   Modules accepted: Orders

## 2019-05-21 NOTE — Addendum Note (Signed)
Addended by: Mariea Clonts D on: 05/21/2019 10:22 AM   Modules accepted: Orders

## 2019-05-22 LAB — CYTOLOGY - PAP: HPV: NOT DETECTED

## 2019-05-22 LAB — CYTOLOGY, (ORAL, ANAL, URETHRAL) ANCILLARY ONLY
Chlamydia: NEGATIVE
Neisseria Gonorrhea: POSITIVE — AB

## 2019-05-22 NOTE — Progress Notes (Signed)
Please give Samantha Fleming a call to let her know that her Pap smear showed some abnormalities in the cells (not cancer) that we will need to follow closely with a repeat pap smear in 1 year (May 2021).  She was also positive for gonorrhea which explains her discharge - she will need to come back for treatment with 250 mg IM Rocephin x 1 and 1g Azithromycin PO x 1. Her partner needs to seek treatment through health department or his PCP. Please abstain from sexual intercourse until both are treated and for 7 days after.

## 2019-05-23 ENCOUNTER — Telehealth: Payer: Self-pay | Admitting: *Deleted

## 2019-05-23 NOTE — Telephone Encounter (Signed)
Relayed to patient. She will be here Tuesday 9am for treatment.  Birth control was not covered on ADAP ($35/month). Is there another option? Andree Coss, RN

## 2019-05-23 NOTE — Telephone Encounter (Signed)
-----   Message from Blanchard Kelch, NP sent at 05/22/2019  5:01 PM EDT ----- Please give Samantha Fleming a call to let her know that her Pap smear showed some abnormalities in the cells (not cancer) that we will need to follow closely with a repeat pap smear in 1 year (May 2021).  She was also positive for gonorrhea which explains her discharge - she will need to come back for treatment with 250 mg IM Rocephin x 1 and 1g Azithromycin PO x 1. Her partner needs to seek treatment through health department or his PCP. Please abstain from sexual intercourse until both are treated and for 7 days after.

## 2019-05-26 NOTE — Telephone Encounter (Signed)
Unfortunately none of the contraceptives outside of the Depakote shot are covered through [employer] and [employer].  The only reason that Depakote is covered is because it is in an office administered medication and not delivered via the pharmacy.  On good Rx I see that [facility] neighborhood offers this prescription for $9 a month.  We can try resending prescription to neighborhood [facility] that is convenient for [place].

## 2019-05-27 ENCOUNTER — Other Ambulatory Visit: Payer: Self-pay

## 2019-05-27 ENCOUNTER — Ambulatory Visit (INDEPENDENT_AMBULATORY_CARE_PROVIDER_SITE_OTHER): Payer: Self-pay | Admitting: *Deleted

## 2019-05-27 DIAGNOSIS — A549 Gonococcal infection, unspecified: Secondary | ICD-10-CM

## 2019-05-27 DIAGNOSIS — Z3009 Encounter for other general counseling and advice on contraception: Secondary | ICD-10-CM

## 2019-05-27 MED ORDER — NORGESTIM-ETH ESTRAD TRIPHASIC 0.18/0.215/0.25 MG-25 MCG PO TABS: 1.0000 | ORAL_TABLET | Freq: Every day | ORAL | 2 refills | Status: DC

## 2019-05-27 MED ORDER — AZITHROMYCIN 250 MG PO TABS
1000.0000 mg | ORAL_TABLET | Freq: Once | ORAL | Status: AC
  Administered 2019-05-27: 1000 mg via ORAL

## 2019-05-27 MED ORDER — CEFTRIAXONE SODIUM 250 MG IJ SOLR
250.0000 mg | Freq: Once | INTRAMUSCULAR | Status: AC
  Administered 2019-05-27: 250 mg via INTRAMUSCULAR

## 2019-06-03 ENCOUNTER — Ambulatory Visit: Payer: Self-pay | Admitting: Internal Medicine

## 2019-06-05 ENCOUNTER — Ambulatory Visit: Payer: Self-pay | Admitting: Internal Medicine

## 2019-09-08 ENCOUNTER — Telehealth: Payer: Self-pay | Admitting: *Deleted

## 2019-09-08 NOTE — Telephone Encounter (Signed)
Received signed release for records from Medical records at [facility] [place], [place]) for continuity of care. Last 2 office notes sent with last labs. Phone: [phone] or [phone] Fax: [phone]  Landis Gandy, RN

## 2019-09-25 ENCOUNTER — Encounter: Payer: Self-pay | Admitting: Obstetrics & Gynecology

## 2020-02-22 LAB — HM HEPATITIS C SCREENING LAB

## 2020-02-24 LAB — HM HIV SCREENING LAB: HM HIV Screening: NEGATIVE

## 2020-06-25 LAB — HM PAP SMEAR: HPV Genotype 16/18,45 Tracking: NEGATIVE

## 2020-09-08 LAB — HM HEPATITIS C SCREENING LAB: HM Hepatitis Screen: NEGATIVE

## 2020-09-08 LAB — LAB REPORT - SCANNED: Chlamydia, Swab/Urine, PCR: NEGATIVE

## 2020-09-29 LAB — LAB REPORT - SCANNED: Chlamydia, Swab/Urine, PCR: NOT DETECTED

## 2021-01-26 ENCOUNTER — Telehealth: Payer: Self-pay

## 2021-01-26 NOTE — Telephone Encounter (Addendum)
[facility] Patient Advocate Encounter  Completed and sent [employer] Advancing Access application for Biktarvy for this patient who is uninsured.    Patient is approved 01/26/21 through 01/26/22.        Clearance Coots, CPhT Specialty Pharmacy Patient [facility] Phone: [phone] Fax:  [phone]

## 2021-02-14 ENCOUNTER — Encounter: Payer: Self-pay | Admitting: Internal Medicine

## 2021-05-24 ENCOUNTER — Other Ambulatory Visit: Payer: Self-pay

## 2021-05-24 ENCOUNTER — Ambulatory Visit (INDEPENDENT_AMBULATORY_CARE_PROVIDER_SITE_OTHER): Payer: Self-pay | Admitting: Internal Medicine

## 2021-05-24 ENCOUNTER — Encounter: Payer: Self-pay | Admitting: Internal Medicine

## 2021-05-24 VITALS — BP 119/79 | HR 65 | Temp 98.2°F | Wt 156.0 lb

## 2021-05-24 DIAGNOSIS — B20 Human immunodeficiency virus [HIV] disease: Secondary | ICD-10-CM

## 2021-05-24 DIAGNOSIS — R1013 Epigastric pain: Secondary | ICD-10-CM

## 2021-05-24 DIAGNOSIS — R768 Other specified abnormal immunological findings in serum: Secondary | ICD-10-CM

## 2021-05-24 DIAGNOSIS — Z716 Tobacco abuse counseling: Secondary | ICD-10-CM

## 2021-05-24 LAB — CBC WITH DIFFERENTIAL/PLATELET: Absolute Monocytes: 374 cells/uL (ref 200–950)

## 2021-05-24 MED ORDER — NICOTINE 14 MG/24HR TD PT24: 14.0000 mg | MEDICATED_PATCH | Freq: Every day | TRANSDERMAL | 1 refills | Status: DC

## 2021-05-24 MED ORDER — ENSURE PO LIQD: 237.0000 mL | Freq: Two times a day (BID) | ORAL | 11 refills | Status: DC

## 2021-05-24 NOTE — Progress Notes (Signed)
RFV: follow up for hiv disease  Patient ID: Samantha Fleming, female   DOB: [birth year], 40 y.o.   MRN: [id number]  HPI Samantha Fleming is a 40yo F last seen 2 years ago, CD 4 count 460/VL<20 (in jan 2020) on biktarvy. She reports that she had relpased and went to [facility]. Last fall she had sudden onset of fever, dark urine, and scleral icterus and was diagnosed with  Hepatitis-  And aki? Hospitalization in sept 2021. Need medical release to get info to know specifics.  She moved to [place], got arrested, incarcerated. Only 2 week break in her biktarvy but otherwise has been taking her medications daily.  Now 8 months without using illicit drugs, and now taking subutex. She thinks she is allergic to component of suboxone.  Living in [place], staying at a friends house.  Still having GI symptoms - vomiting, abdominal pain occasionally- in epigastric region  Labs from end march cr is nl, and cmp nl.   Outpatient Encounter Medications as of 05/24/2021  Medication Sig  . BIKTARVY 50-200-25 MG TABS tablet TAKE 1 TABLET BY MOUTH DAILY  . buprenorphine (SUBUTEX) 2 MG SUBL SL tablet Place 16 mg under the tongue daily.  Marland Kitchen albuterol (PROVENTIL HFA;VENTOLIN HFA) 108 (90 Base) MCG/ACT inhaler Inhale 2 puffs into the lungs every 6 (six) hours as needed for wheezing or shortness of breath. (Patient not taking: Reported on 05/24/2021)  . busPIRone (BUSPAR) 15 MG tablet Take 1 tablet (15 mg total) by mouth 2 (two) times daily. (Patient not taking: Reported on 05/24/2021)  . clonazePAM (KLONOPIN) 1 MG tablet Take 1 tablet (1 mg total) by mouth 2 (two) times daily as needed for anxiety (Patient needs to [facility] appointment with a Psychiatrist on January 29, 2018.  Psychiatrist to order additional refills.)). (Patient not taking: No sig reported)  . escitalopram (LEXAPRO) 5 MG tablet Take 1 tablet (5 mg total) by mouth daily. (Patient not taking: Reported on 05/24/2021)  . guaiFENesin-codeine 100-10 MG/5ML syrup  Take 5 mLs by mouth every 6 (six) hours as needed for cough. (Patient not taking: No sig reported)  . ibuprofen (ADVIL,MOTRIN) 800 MG tablet Take 1 tablet (800 mg total) by mouth every 8 (eight) hours as needed. (Patient not taking: Reported on 05/24/2021)  . Norgestimate-Ethinyl Estradiol Triphasic 0.18/0.215/0.25 MG-25 MCG tab Take 1 tablet by mouth daily. (Patient not taking: Reported on 05/24/2021)  . predniSONE (DELTASONE) 10 MG tablet Take 1 tablet (10 mg total) by mouth daily with breakfast. (Patient not taking: No sig reported)  . traZODone (DESYREL) 100 MG tablet Take 1 tablet (100 mg total) by mouth at bedtime. (Patient not taking: Reported on 05/24/2021)   No facility-administered encounter medications on file as of 05/24/2021.     Patient Active Problem List   Diagnosis Date Noted  . Screening for cervical cancer 05/20/2019  . Contraceptive education 05/20/2019  . Cyst of Bartholin's gland duct 05/20/2019  . Vaginal discharge 05/20/2019  . Tremor 10/06/2015  . Insomnia 09/15/2015  . 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 Hepatitis C antibody test positive 04/29/2015  . HIV disease (HCC) 04/29/2015  . PTSD (post-traumatic stress disorder) 04/29/2015  . Menorrhagia with irregular cycle 04/29/2015  . GAD (generalized anxiety disorder) 04/29/2015  . 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 Smoker 04/29/2015  . Fibromyalgia 04/29/2015     Health Maintenance Due  Topic Date Due  . COVID-19 Vaccine (1) Never done  . TETANUS/TDAP  Never done  Family PF:[id number]  Sochx: smoking +1PPD. No drinking Review of Systems + 10 diffuse ROS Physical Exam   BP 119/79   Pulse 65   Temp 98.2 F (36.8 C) (Oral)   Wt 156 lb (70.8 kg)   SpO2 98%   BMI 26.78 kg/m   Physical Exam  Constitutional:  oriented to person, place, and time. appears well-developed and well-nourished. No distress.  HENT: [place]/AT, PERRLA, no scleral  icterus Mouth/Throat: Oropharynx is clear and moist. No oropharyngeal exudate.  Cardiovascular: Normal rate, regular rhythm and normal heart sounds. Exam reveals no gallop and no friction rub.  No murmur heard.  Pulmonary/Chest: Effort normal and breath sounds normal. No respiratory distress.  has no wheezes.  Neck = supple, no nuchal rigidity Abdominal: Soft. Bowel sounds are normal.  exhibits no distension. +epigastric tenderness.  Lymphadenopathy: no cervical adenopathy. No axillary adenopathy Neurological: alert and oriented to person, place, and time.  Skin: Skin is warm and dry. No rash noted. No erythema.  Psychiatric: a normal mood and affect.  behavior is normal.   Lab Results  Component Value Date   CD4TCELL 34 01/15/2019   Lab Results  Component Value Date   CD4TABS 460 01/15/2019   CD4TABS 510 02/14/2018   CD4TABS 780 05/31/2017   Lab Results  Component Value Date   HIV1RNAQUANT <20 NOT DETECTED 01/15/2019   Lab Results  Component Value Date   HEPBSAB POS (A) 04/14/2015   Lab Results  Component Value Date   LABRPR NON-REACTIVE 01/15/2019    CBC Lab Results  Component Value Date   WBC 6.0 01/15/2019   RBC 4.23 01/15/2019   HGB 13.5 01/15/2019   HCT 40.0 01/15/2019   PLT 248 01/15/2019   MCV 94.6 01/15/2019   MCH 31.9 01/15/2019   MCHC 33.8 01/15/2019   RDW 13.0 01/15/2019   LYMPHSABS 1,284 01/15/2019   MONOABS 384 05/31/2017   EOSABS 78 01/15/2019    BMET Lab Results  Component Value Date   NA 138 01/15/2019   K 4.8 01/15/2019   CL 104 01/15/2019   CO2 29 01/15/2019   GLUCOSE 94 01/15/2019   BUN 14 01/15/2019   CREATININE 0.79 01/15/2019   CALCIUM 10.0 01/15/2019   GFRNONAA 96 01/15/2019   GFRAA 112 01/15/2019      Assessment and Plan  Epigastric pain = eases with tums. Wonder if this is ?peptic ulcer vs mild pancreatitis. Will get RUQ U/S since that would also help with hep c   hiv disease = will check labs and continue  biktarvy  Moderate appetite = will try Ensure supplementation temporarily while having gi issues  Chronic hep c = check labs to see if needs treatment  Smoking cessation = willing to get Nicotine patch

## 2021-05-25 LAB — COMPLETE METABOLIC PANEL WITH GFR: Creat: 0.89 mg/dL (ref 0.50–1.10)

## 2021-05-25 LAB — LIPID PANEL: Cholesterol: 191 mg/dL (ref <200)

## 2021-05-26 LAB — CBC WITH DIFFERENTIAL/PLATELET
Basophils Absolute: 73 cells/uL (ref 0–200)
Basophils Relative: 1.4 %
Eosinophils Absolute: 73 cells/uL (ref 15–500)
Eosinophils Relative: 1.4 %
HCT: 40 % (ref 35.0–45.0)
Hemoglobin: 13.4 g/dL (ref 11.7–15.5)
Lymphs Abs: 2158 cells/uL (ref 850–3900)
MCH: 31.2 pg (ref 27.0–33.0)
MCHC: 33.5 g/dL (ref 32.0–36.0)
MCV: 93.2 fL (ref 80.0–100.0)
MPV: 11.1 fL (ref 7.5–12.5)
Monocytes Relative: 7.2 %
Neutro Abs: 2522 cells/uL (ref 1500–7800)
Neutrophils Relative %: 48.5 %
Platelets: 199 10*3/uL (ref 140–400)
RBC: 4.29 10*6/uL (ref 3.80–5.10)
RDW: 12.2 % (ref 11.0–15.0)
Total Lymphocyte: 41.5 %
WBC: 5.2 10*3/uL (ref 3.8–10.8)

## 2021-05-26 LAB — COMPLETE METABOLIC PANEL WITH GFR
AG Ratio: 2.2 (calc) (ref 1.0–2.5)
ALT: 9 U/L (ref 6–29)
AST: 14 U/L (ref 10–30)
Albumin: 4.9 g/dL (ref 3.6–5.1)
Alkaline phosphatase (APISO): 42 U/L (ref 31–125)
BUN: 9 mg/dL (ref 7–25)
CO2: 25 mmol/L (ref 20–32)
Calcium: 9.4 mg/dL (ref 8.6–10.2)
Chloride: 104 mmol/L (ref 98–110)
GFR, Est African American: 95 mL/min/{1.73_m2} (ref >=60)
GFR, Est Non African American: 82 mL/min/{1.73_m2} (ref >=60)
Globulin: 2.2 g/dL (calc) (ref 1.9–3.7)
Glucose, Bld: 81 mg/dL (ref 65–99)
Potassium: 4.2 mmol/L (ref 3.5–5.3)
Sodium: 138 mmol/L (ref 135–146)
Total Bilirubin: 0.4 mg/dL (ref 0.2–1.2)
Total Protein: 7.1 g/dL (ref 6.1–8.1)

## 2021-05-26 LAB — HIV-1 RNA QUANT-NO REFLEX-BLD
HIV 1 RNA Quant: NOT DETECTED Copies/mL
HIV-1 RNA Quant, Log: NOT DETECTED Log cps/mL

## 2021-05-26 LAB — T-HELPER CELL (CD4) - (RCID CLINIC ONLY)
CD4 % Helper T Cell: 36 % (ref 33–65)
CD4 T Cell Abs: 768 /uL (ref 400–1790)

## 2021-05-26 LAB — HEPATITIS C RNA QUANTITATIVE
HCV Quantitative Log: <1.18 log IU/mL
HCV RNA, PCR, QN: <15 IU/mL

## 2021-05-26 LAB — LIPID PANEL
HDL: 50 mg/dL (ref >=50)
LDL Cholesterol (Calc): 115 mg/dL (calc) — ABNORMAL HIGH
Non-HDL Cholesterol (Calc): 141 mg/dL (calc) — ABNORMAL HIGH (ref <130)
Total CHOL/HDL Ratio: 3.8 (calc) (ref <5.0)
Triglycerides: 147 mg/dL (ref <150)

## 2021-05-26 LAB — LIPASE: Lipase: 27 U/L (ref 7–60)

## 2021-05-26 LAB — H. PYLORI BREATH TEST: H. pylori Breath Test: NOT DETECTED

## 2021-05-26 LAB — RPR: RPR Ser Ql: NONREACTIVE

## 2021-05-31 ENCOUNTER — Telehealth: Payer: Self-pay

## 2021-05-31 NOTE — Telephone Encounter (Signed)
-----   Message from Bobette Mo, CPhT sent at 05/31/2021 11:05 AM EDT ----- Regarding: Nicotine Hello ,  This patient called and left a message saying she went to [facility] to pick up her Nicotine Patches and they was for the wrong one, she said she needs the Nicotine Patches Step 1 because she smokes 10 cigarettes a day. The script was Nicotine Patches Step 2. Her phone number is # [phone].   Thank You,  Clearance Coots, CPhT Specialty Pharmacy Patient [facility] for Infectious [facility] Phone: [phone] Fax: [phone]

## 2021-06-08 ENCOUNTER — Other Ambulatory Visit: Payer: Self-pay | Admitting: Pharmacist

## 2021-06-08 DIAGNOSIS — Z716 Tobacco abuse counseling: Secondary | ICD-10-CM

## 2021-06-08 MED ORDER — NICOTINE 21 MG/24HR TD PT24: 21.0000 mg | MEDICATED_PATCH | Freq: Every day | TRANSDERMAL | 1 refills | Status: DC

## 2021-06-08 NOTE — Telephone Encounter (Signed)
Sure, I'll send it in.

## 2021-06-21 ENCOUNTER — Ambulatory Visit (INDEPENDENT_AMBULATORY_CARE_PROVIDER_SITE_OTHER): Payer: Self-pay | Admitting: Internal Medicine

## 2021-06-21 ENCOUNTER — Other Ambulatory Visit: Payer: Self-pay

## 2021-06-21 ENCOUNTER — Encounter: Payer: Self-pay | Admitting: Internal Medicine

## 2021-06-21 VITALS — BP 105/69 | HR 89 | Temp 98.5°F | Ht 65.0 in | Wt 154.0 lb

## 2021-06-21 DIAGNOSIS — R12 Heartburn: Secondary | ICD-10-CM

## 2021-06-21 DIAGNOSIS — G59 Mononeuropathy in diseases classified elsewhere: Secondary | ICD-10-CM

## 2021-06-21 DIAGNOSIS — B20 Human immunodeficiency virus [HIV] disease: Secondary | ICD-10-CM

## 2021-06-21 DIAGNOSIS — Z716 Tobacco abuse counseling: Secondary | ICD-10-CM

## 2021-06-21 DIAGNOSIS — F419 Anxiety disorder, unspecified: Secondary | ICD-10-CM

## 2021-06-21 MED ORDER — FLUOXETINE HCL (PMDD) 20 MG PO TABS: 1.0000 | ORAL_TABLET | Freq: Every day | ORAL | 3 refills | Status: DC

## 2021-06-21 MED ORDER — GABAPENTIN 300 MG PO CAPS: 300.0000 mg | ORAL_CAPSULE | Freq: Every day | ORAL | 1 refills | Status: DC

## 2021-06-21 MED ORDER — OMEPRAZOLE 20 MG PO CPDR: 20.0000 mg | DELAYED_RELEASE_CAPSULE | Freq: Every day | ORAL | 0 refills | Status: DC

## 2021-06-21 NOTE — Progress Notes (Signed)
RFV: follow up for hiv disease  Patient ID: Samantha Fleming, female   DOB: 1981/09/02, 40 y.o.   MRN: [id number]  HPI 40yo F with hiv disease, cd 4 count of 768/VL<20 in may 2022. On biktarvy.  She continues to take subutex. She reports that the patch given to her was too low dose given that she smoke a pack per day. Her anxiety still is problematic for her. She also noticing having heart burn, has not tried any otc medication. Also has numbness to feet that has worsened.  Outpatient Encounter Medications as of 06/21/2021  Medication Sig   BIKTARVY 50-200-25 MG TABS tablet TAKE 1 TABLET BY MOUTH DAILY   buprenorphine (SUBUTEX) 2 MG SUBL SL tablet Place 16 mg under the tongue daily.   Ensure (ENSURE) Take 237 mLs by mouth 2 (two) times daily between meals. Dispense quantity for 1 month   albuterol (PROVENTIL HFA;VENTOLIN HFA) 108 (90 Base) MCG/ACT inhaler Inhale 2 puffs into the lungs every 6 (six) hours as needed for wheezing or shortness of breath. (Patient not taking: No sig reported)   nicotine (NICODERM CQ - DOSED IN MG/24 HOURS) 21 mg/24hr patch Place 1 patch (21 mg total) onto the skin daily. (Patient not taking: Reported on 06/21/2021)   No facility-administered encounter medications on file as of 06/21/2021.     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  Review of Systems 12 point ros is negative except what is mentioned above Physical Exam   BP 105/69   Pulse 89   Temp 98.5 F (36.9 C) (Oral)   Ht 5\' 5"  (1.651 m)   Wt 154 lb (69.9 kg)   SpO2 97%   BMI 25.63 kg/m   Physical Exam  Constitutional:  oriented to person, place, and time. appears well-developed and well-nourished. No distress.  HENT: [place]/AT, PERRLA, no scleral icterus Mouth/Throat: Oropharynx is clear and moist. No oropharyngeal exudate.  Cardiovascular: Normal rate, regular rhythm and normal heart sounds. Exam reveals no gallop and no friction rub.  No murmur heard.  Pulmonary/Chest: Effort normal and breath sounds normal. No respiratory distress.  has no wheezes.  Neck = supple, no nuchal rigidity Abdominal: Soft. Bowel sounds are normal.  exhibits no distension. There is no tenderness.  Lymphadenopathy: no cervical adenopathy. No axillary adenopathy Neurological: alert and oriented to person, place, and time.  Skin: Skin is warm and dry. No rash noted. No erythema.  Psychiatric: a normal mood and affect.  behavior is normal.   Lab Results  Component Value Date   CD4TCELL 36 05/24/2021   Lab Results  Component Value Date   CD4TABS 768 05/24/2021   CD4TABS 460 01/15/2019  CD4TABS 510 02/14/2018   Lab Results  Component Value Date   HIV1RNAQUANT Not Detected 05/24/2021   Lab Results  Component Value Date   HEPBSAB POS (A) 04/14/2015   Lab Results  Component Value Date   LABRPR NON-REACTIVE 05/24/2021    CBC Lab Results  Component Value Date   WBC 5.2 05/24/2021   RBC 4.29 05/24/2021   HGB 13.4 05/24/2021   HCT 40.0 05/24/2021   PLT 199 05/24/2021   MCV 93.2 05/24/2021   MCH 31.2 05/24/2021   MCHC 33.5 05/24/2021   RDW 12.2 05/24/2021   LYMPHSABS 2,158  05/24/2021   MONOABS 384 05/31/2017   EOSABS 73 05/24/2021    BMET Lab Results  Component Value Date   NA 138 05/24/2021   K 4.2 05/24/2021   CL 104 05/24/2021   CO2 25 05/24/2021   GLUCOSE 81 05/24/2021   BUN 9 05/24/2021   CREATININE 0.89 05/24/2021   CALCIUM 9.4 05/24/2021   GFRNONAA 82 05/24/2021   GFRAA 95 05/24/2021      Assessment and Plan  Heartburn = omeprazole 10mg  dailyPRN  Peripheral neuropathy = gabapentin 300mg  at night  Anxiety = fluoxetine 10mg  daily for a week then increase fluoxetine 20mg  daily  Smoking cessation = we increased dosage to 21 mg  patch.  is getting medical release to review hospitalization with acute jaundice

## 2021-06-22 ENCOUNTER — Telehealth: Payer: Self-pay

## 2021-06-22 ENCOUNTER — Other Ambulatory Visit: Payer: Self-pay

## 2021-06-22 DIAGNOSIS — B20 Human immunodeficiency virus [HIV] disease: Secondary | ICD-10-CM

## 2021-06-22 MED ORDER — BIKTARVY 50-200-25 MG PO TABS: 1.0000 | ORAL_TABLET | Freq: Every day | ORAL | 2 refills | Status: DC

## 2021-06-22 NOTE — Telephone Encounter (Signed)
Received notice from [facility] that AD[facility] does not cover Fluoxetine prescription that provider sent. Forwarding to pharmacy team + provider for medication change.   Samantha Vignola Loyola Mast, RN

## 2021-06-22 NOTE — Telephone Encounter (Signed)
Patient would like Dr. Drue Second to have additional medical records. Signed medical release forms faxed to the following at patient's request:   [facility] Department P: [phone] F: [phone]  [facility] [place] P: [phone] F: [phone]  [facility], [place] P: [phone] F: [phone]  Sandie Ano, RN

## 2021-06-22 NOTE — Telephone Encounter (Signed)
Records Rcvd from [facility], [place] P: [phone] F: [phone]  Placed in DR Drue Second box for review.

## 2021-07-19 ENCOUNTER — Ambulatory Visit: Payer: Self-pay | Admitting: Internal Medicine

## 2021-08-20 ENCOUNTER — Other Ambulatory Visit: Payer: Self-pay | Admitting: Internal Medicine

## 2021-09-01 ENCOUNTER — Ambulatory Visit: Payer: Self-pay

## 2021-09-01 ENCOUNTER — Other Ambulatory Visit: Payer: Self-pay

## 2021-09-01 ENCOUNTER — Ambulatory Visit (INDEPENDENT_AMBULATORY_CARE_PROVIDER_SITE_OTHER): Payer: Self-pay | Admitting: Internal Medicine

## 2021-09-01 ENCOUNTER — Encounter: Payer: Self-pay | Admitting: Internal Medicine

## 2021-09-01 VITALS — BP 107/71 | HR 81 | Wt 157.0 lb

## 2021-09-01 DIAGNOSIS — G4709 Other insomnia: Secondary | ICD-10-CM

## 2021-09-01 DIAGNOSIS — B20 Human immunodeficiency virus [HIV] disease: Secondary | ICD-10-CM

## 2021-09-01 DIAGNOSIS — G59 Mononeuropathy in diseases classified elsewhere: Secondary | ICD-10-CM

## 2021-09-01 DIAGNOSIS — F419 Anxiety disorder, unspecified: Secondary | ICD-10-CM

## 2021-09-01 MED ORDER — GABAPENTIN 300 MG PO CAPS: 300.0000 mg | ORAL_CAPSULE | Freq: Two times a day (BID) | ORAL | 2 refills | Status: DC

## 2021-09-01 MED ORDER — SERTRALINE HCL 50 MG PO TABS: 50.0000 mg | ORAL_TABLET | Freq: Every day | ORAL | 2 refills | Status: DC

## 2021-09-01 MED ORDER — TRAZODONE HCL 50 MG PO TABS: 50.0000 mg | ORAL_TABLET | Freq: Every evening | ORAL | 2 refills | Status: DC | PRN

## 2021-09-01 NOTE — Progress Notes (Signed)
RFV: follow up for hiv disease, and anxiety Patient ID: Samantha Fleming, female   DOB: 1981-12-06, 40 y.o.   MRN: [id number]  HPI Samantha Fleming is 40yo F with well controlled hiv disease, also continues on subutex. At our last appointment, she was started on prozac for anxiety/depression symptoms for which she took for roughly 1 month but Felt more agitated, jittery thus she stopped. She reports poor sleep, in past did take trazodone which helped.  She is noticing feeling stressed from helping her friends daughter  Sochx: still smoking  Outpatient Encounter Medications as of 09/01/2021  Medication Sig   bictegravir-emtricitabine-tenofovir AF (BIKTARVY) 50-200-25 MG TABS tablet Take 1 tablet by mouth daily.   albuterol (PROVENTIL HFA;VENTOLIN HFA) 108 (90 Base) MCG/ACT inhaler Inhale 2 puffs into the lungs every 6 (six) hours as needed for wheezing or shortness of breath. (Patient not taking: No sig reported)   buprenorphine (SUBUTEX) 2 MG SUBL SL tablet Place 16 mg under the tongue daily.   Ensure (ENSURE) Take 237 mLs by mouth 2 (two) times daily between meals. Dispense quantity for 1 month   Fluoxetine HCl, PMDD, 20 MG TABS Take 1 tablet (20 mg total) by mouth daily. Start with 1/2 tab per day x 6 days then increase to full tablet   gabapentin (NEURONTIN) 300 MG capsule TAKE 1 CAPSULE(300 MG) BY MOUTH AT BEDTIME   nicotine (NICODERM CQ - DOSED IN MG/24 HOURS) 21 mg/24hr patch Place 1 patch (21 mg total) onto the skin daily. (Patient not taking: Reported on 06/21/2021)   omeprazole (PRILOSEC) 20 MG capsule Take 1 capsule (20 mg total) by mouth daily. If you have heartburn   No facility-administered encounter medications on file as of 09/01/2021.     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   Hepatitis C antibody test positive 04/29/2015   HIV disease (HCC) 04/29/2015   PTSD (post-traumatic stress disorder) 04/29/2015   Menorrhagia with irregular cycle 04/29/2015   GAD (generalized anxiety disorder) 04/29/2015   History of intravenous drug use in remission 04/29/2015   Smoker 04/29/2015   Fibromyalgia 04/29/2015     Health Maintenance Due  Topic Date Due   COVID-19 Vaccine (1) Never done   TETANUS/TDAP  Never done   Pneumococcal Vaccine 89-55 Years old (2 - PCV) 08/26/2015   INFLUENZA VACCINE  07/25/2021     Review of Systems 12 point ros is negative except what is mentioned above Physical Exam   BP 107/71   Pulse 81   Wt 157 lb (71.2 kg)   BMI 26.13 kg/m   Physical Exam  Constitutional:  oriented to person, place, and time. appears well-developed and well-nourished. No distress.  HENT: [place]/AT, PERRLA, no scleral icterus Mouth/Throat: Oropharynx is clear and moist. No oropharyngeal exudate.  Lymphadenopathy: no cervical adenopathy. No axillary adenopathy Neurological: alert and oriented to person, place, and time.  Skin: Skin is warm and dry. No rash noted. No erythema.  Psychiatric: a normal mood and affect.  behavior is normal.   Lab Results  Component Value Date   CD4TCELL 36 05/24/2021   Lab Results  Component Value Date   CD4TABS 768 05/24/2021   CD4TABS 460 01/15/2019   CD4TABS 510 02/14/2018   Lab Results  Component  Value Date   HIV1RNAQUANT Not Detected 05/24/2021   Lab Results  Component Value Date   HEPBSAB POS (A) 04/14/2015   Lab Results  Component Value Date   LABRPR NON-REACTIVE 05/24/2021    CBC Lab Results  Component Value Date   WBC 5.2 05/24/2021   RBC 4.29 05/24/2021   HGB 13.4 05/24/2021   HCT 40.0 05/24/2021   PLT 199 05/24/2021   MCV 93.2 05/24/2021   MCH 31.2 05/24/2021   MCHC 33.5 05/24/2021   RDW 12.2 05/24/2021   LYMPHSABS 2,158 05/24/2021    MONOABS 384 05/31/2017   EOSABS 73 05/24/2021    BMET Lab Results  Component Value Date   NA 138 05/24/2021   K 4.2 05/24/2021   CL 104 05/24/2021   CO2 25 05/24/2021   GLUCOSE 81 05/24/2021   BUN 9 05/24/2021   CREATININE 0.89 05/24/2021   CALCIUM 9.4 05/24/2021   GFRNONAA 82 05/24/2021   GFRAA 95 05/24/2021      Assessment and Plan  HIV disease = well controlled. Continue on biktarvy  Peripheral neuropathy = having some improvement with gabapentin, but only at dinner. Will twice a day  Anxiety = will do a trial of sertraline(zoloft); 4 weeks for and introduce to new counselor.  Insomnia = will do low dose of trazodone if needed for sleep  Rtc in 4-6 wk

## 2021-09-13 ENCOUNTER — Other Ambulatory Visit: Payer: Self-pay | Admitting: Internal Medicine

## 2021-09-13 DIAGNOSIS — B20 Human immunodeficiency virus [HIV] disease: Secondary | ICD-10-CM

## 2021-10-13 ENCOUNTER — Ambulatory Visit: Payer: Self-pay | Admitting: Infectious Diseases

## 2021-10-17 ENCOUNTER — Ambulatory Visit: Payer: Self-pay | Admitting: Infectious Diseases

## 2021-11-09 ENCOUNTER — Other Ambulatory Visit: Payer: Self-pay | Admitting: Internal Medicine

## 2021-11-09 ENCOUNTER — Telehealth: Payer: Self-pay

## 2021-11-09 DIAGNOSIS — B20 Human immunodeficiency virus [HIV] disease: Secondary | ICD-10-CM

## 2021-11-09 NOTE — Telephone Encounter (Signed)
Attempted to contact patient to schedule overdue lab visit and provider visit. Confirmed with pharmacy last medication refill last month. Electronic refill sent to pharmacy. Samantha Fleming

## 2021-12-13 ENCOUNTER — Other Ambulatory Visit: Payer: Self-pay | Admitting: Internal Medicine

## 2021-12-14 ENCOUNTER — Other Ambulatory Visit: Payer: Self-pay | Admitting: Internal Medicine

## 2021-12-27 ENCOUNTER — Other Ambulatory Visit: Payer: Self-pay | Admitting: Internal Medicine

## 2021-12-28 NOTE — Telephone Encounter (Signed)
Pt has appt on 1/10.

## 2021-12-30 NOTE — Telephone Encounter (Signed)
Romeo Apple, pharmacist at [facility] called to follow up on refill request. Advised that patient has appointment on Tuesday with provider and can discuss refills then. He states he will provide an emergency supply to patient to last until her appointment.   Sandie Ano, RN

## 2022-01-03 ENCOUNTER — Ambulatory Visit (INDEPENDENT_AMBULATORY_CARE_PROVIDER_SITE_OTHER): Payer: Self-pay | Admitting: Internal Medicine

## 2022-01-03 ENCOUNTER — Encounter: Payer: Self-pay | Admitting: Internal Medicine

## 2022-01-03 ENCOUNTER — Other Ambulatory Visit: Payer: Self-pay

## 2022-01-03 VITALS — BP 116/81 | HR 84 | Temp 98.5°F | Ht 66.0 in | Wt 163.0 lb

## 2022-01-03 DIAGNOSIS — B20 Human immunodeficiency virus [HIV] disease: Secondary | ICD-10-CM

## 2022-01-03 DIAGNOSIS — R12 Heartburn: Secondary | ICD-10-CM

## 2022-01-03 DIAGNOSIS — K3189 Other diseases of stomach and duodenum: Secondary | ICD-10-CM

## 2022-01-03 DIAGNOSIS — R300 Dysuria: Secondary | ICD-10-CM

## 2022-01-03 MED ORDER — DICYCLOMINE HCL 20 MG PO TABS: 20.0000 mg | ORAL_TABLET | Freq: Three times a day (TID) | ORAL | 11 refills | Status: DC

## 2022-01-03 MED ORDER — BIKTARVY 50-200-25 MG PO TABS: 1.0000 | ORAL_TABLET | Freq: Every day | ORAL | 11 refills | Status: DC

## 2022-01-03 MED ORDER — ALBUTEROL SULFATE HFA 108 (90 BASE) MCG/ACT IN AERS: 2.0000 | INHALATION_SPRAY | Freq: Four times a day (QID) | RESPIRATORY_TRACT | 2 refills | Status: DC | PRN

## 2022-01-03 MED ORDER — TRAZODONE HCL 50 MG PO TABS: 50.0000 mg | ORAL_TABLET | Freq: Every evening | ORAL | 6 refills | Status: DC | PRN

## 2022-01-03 MED ORDER — SERTRALINE HCL 50 MG PO TABS: 50.0000 mg | ORAL_TABLET | Freq: Every day | ORAL | 6 refills | Status: DC

## 2022-01-03 NOTE — Progress Notes (Signed)
RFV: follow up for hiv disease  Patient ID: Samantha Fleming, female   DOB: Dec 14, 1981, 41 y.o.   MRN: [id number]  HPI Samantha Fleming is a 41yo F with history of well controlled hiv disease, anxiety, also on subutex. She reports having occasional +back pain but feels its due to abdominal distention , occ painful urination. Denies urinary frequency or blood in her urine.   ROS:   Often 3-4 loose stools per day. No blood stools. But noticeably black stools,- 3 or 4 weeks ago. Abdominal distention - takes gasx or tums.   Outpatient Encounter Medications as of 01/03/2022  Medication Sig   BIKTARVY 50-200-25 MG TABS tablet TAKE 1 TABLET BY MOUTH DAILY   buprenorphine (SUBUTEX) 2 MG SUBL SL tablet Place 16 mg under the tongue daily.   Ensure (ENSURE) Take 237 mLs by mouth 2 (two) times daily between meals. Dispense quantity for 1 month   gabapentin (NEURONTIN) 300 MG capsule TAKE 1 CAPSULE(300 MG) BY MOUTH TWICE DAILY   [DISCONTINUED] sertraline (ZOLOFT) 50 MG tablet Take 1 tablet (50 mg total) by mouth daily.   [DISCONTINUED] traZODone (DESYREL) 50 MG tablet Take 1 tablet (50 mg total) by mouth at bedtime as needed for sleep.   albuterol (PROVENTIL HFA;VENTOLIN HFA) 108 (90 Base) MCG/ACT inhaler Inhale 2 puffs into the lungs every 6 (six) hours as needed for wheezing or shortness of breath. (Patient not taking: Reported on 05/24/2021)   sertraline (ZOLOFT) 50 MG tablet Take 1 tablet (50 mg total) by mouth daily.   traZODone (DESYREL) 50 MG tablet Take 1 tablet (50 mg total) by mouth at bedtime as needed for sleep.   [DISCONTINUED] nicotine (NICODERM CQ - DOSED IN MG/24 HOURS) 21 mg/24hr patch Place 1 patch (21 mg total) onto the skin daily. (Patient not taking: Reported on 06/21/2021)   [DISCONTINUED] omeprazole (PRILOSEC) 20 MG capsule Take 1 capsule (20 mg total) by mouth daily. If you have heartburn (Patient not taking: Reported on 01/03/2022)   No facility-administered encounter medications on file  as of 01/03/2022.     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   Hepatitis C antibody test positive 04/29/2015   HIV disease (HCC) 04/29/2015   PTSD (post-traumatic stress disorder) 04/29/2015   Menorrhagia with irregular cycle 04/29/2015   GAD (generalized anxiety disorder) 04/29/2015   History of intravenous drug use in remission 04/29/2015   Smoker 04/29/2015   Fibromyalgia 04/29/2015     Health Maintenance Due  Topic Date Due   COVID-19 Vaccine (1) Never done   Pneumococcal Vaccine 66-35 Years old (1 - PCV) 09/13/1988   TETANUS/TDAP  Never done   INFLUENZA VACCINE  07/25/2021    Ros: 12 point ros is negative except what is mentioned above   Physical Exam   BP 116/81    Pulse 84    Temp 98.5 F (36.9 C) (Oral)    Ht 5\' 6"  (1.676 m)    Wt 163 lb (73.9 kg)    SpO2 98%    BMI 26.31 kg/m   Physical Exam  Constitutional:  oriented to person, place, and time. appears well-developed and well-nourished. No distress.  HENT: [place]/AT, PERRLA, no scleral icterus Mouth/Throat: Oropharynx is clear and moist. No oropharyngeal exudate.  Cardiovascular: Normal rate,  regular rhythm and normal heart sounds. Exam reveals no gallop and no friction rub.  No murmur heard.  Pulmonary/Chest: Effort normal and breath sounds normal. No respiratory distress.  has no wheezes.  Neck = supple, no nuchal rigidity Abdominal: Soft. Bowel sounds are normal.  exhibits distension. There is no tenderness.  Lymphadenopathy: no cervical adenopathy. No axillary adenopathy Neurological: alert and oriented to person, place, and time.  Skin: Skin is warm and dry. No rash noted. No erythema.  Psychiatric: a normal mood and affect.  behavior is normal.    Lab Results  Component Value Date   CD4TCELL 36 05/24/2021   Lab Results  Component Value Date   CD4TABS 768 05/24/2021   CD4TABS 460 01/15/2019   CD4TABS 510 02/14/2018   Lab Results  Component Value Date   HIV1RNAQUANT Not Detected 05/24/2021   Lab Results  Component Value Date   HEPBSAB POS (A) 04/14/2015   Lab Results  Component Value Date   LABRPR NON-REACTIVE 05/24/2021    CBC Lab Results  Component Value Date   WBC 5.2 05/24/2021   RBC 4.29 05/24/2021   HGB 13.4 05/24/2021   HCT 40.0 05/24/2021   PLT 199 05/24/2021   MCV 93.2 05/24/2021   MCH 31.2 05/24/2021   MCHC 33.5 05/24/2021   RDW 12.2 05/24/2021   LYMPHSABS 2,158 05/24/2021   MONOABS 384 05/31/2017   EOSABS 73 05/24/2021    BMET Lab Results  Component Value Date   NA 138 05/24/2021   K 4.2 05/24/2021   CL 104 05/24/2021   CO2 25 05/24/2021   GLUCOSE 81 05/24/2021   BUN 9 05/24/2021   CREATININE 0.89 05/24/2021   CALCIUM 9.4 05/24/2021   GFRNONAA 82 05/24/2021   GFRAA 95 05/24/2021      Assessment and Plan  HIV disease= will plan to check 6 month+ labs including cd 4 count and VL,   Health maintenance =will do STI check   Dysuria = will check ua and urine culture to see if need to treat for uti  Abdominal distention = can continue with OTC meds. Consider doing trial of probiotics yogurt to see if any improvement. If having more GERD like symptoms then we can do trial of omeprazole

## 2022-01-03 NOTE — Patient Instructions (Signed)
[email or web address]    ------- the website with low cost medication for your albuterol

## 2022-01-04 ENCOUNTER — Encounter: Payer: Self-pay | Admitting: Infectious Diseases

## 2022-01-05 LAB — HIV-1 RNA QUANT-NO REFLEX-BLD
HIV 1 RNA Quant: NOT DETECTED Copies/mL
HIV-1 RNA Quant, Log: NOT DETECTED Log cps/mL

## 2022-01-05 LAB — CBC WITH DIFFERENTIAL/PLATELET
Absolute Monocytes: 504 cells/uL (ref 200–950)
Basophils Absolute: 108 cells/uL (ref 0–200)
Basophils Relative: 1.5 %
Eosinophils Absolute: 137 cells/uL (ref 15–500)
Eosinophils Relative: 1.9 %
HCT: 41.3 % (ref 35.0–45.0)
Hemoglobin: 13.6 g/dL (ref 11.7–15.5)
Lymphs Abs: 2340 cells/uL (ref 850–3900)
MCH: 31.4 pg (ref 27.0–33.0)
MCHC: 32.9 g/dL (ref 32.0–36.0)
MCV: 95.4 fL (ref 80.0–100.0)
MPV: 11.4 fL (ref 7.5–12.5)
Monocytes Relative: 7 %
Neutro Abs: 4111 cells/uL (ref 1500–7800)
Neutrophils Relative %: 57.1 %
Platelets: 205 10*3/uL (ref 140–400)
RBC: 4.33 10*6/uL (ref 3.80–5.10)
RDW: 12.9 % (ref 11.0–15.0)
Total Lymphocyte: 32.5 %
WBC: 7.2 10*3/uL (ref 3.8–10.8)

## 2022-01-05 LAB — COMPLETE METABOLIC PANEL WITH GFR
AG Ratio: 2 (calc) (ref 1.0–2.5)
ALT: 8 U/L (ref 6–29)
AST: 15 U/L (ref 10–30)
Albumin: 4.5 g/dL (ref 3.6–5.1)
Alkaline phosphatase (APISO): 42 U/L (ref 31–125)
BUN/Creatinine Ratio: 11 (calc) (ref 6–22)
BUN: 11 mg/dL (ref 7–25)
CO2: 27 mmol/L (ref 20–32)
Calcium: 9.1 mg/dL (ref 8.6–10.2)
Chloride: 105 mmol/L (ref 98–110)
Creat: 0.99 mg/dL — ABNORMAL HIGH (ref 0.50–0.97)
Globulin: 2.2 g/dL (calc) (ref 1.9–3.7)
Glucose, Bld: 80 mg/dL (ref 65–99)
Potassium: 4.2 mmol/L (ref 3.5–5.3)
Sodium: 138 mmol/L (ref 135–146)
Total Bilirubin: 0.3 mg/dL (ref 0.2–1.2)
Total Protein: 6.7 g/dL (ref 6.1–8.1)
eGFR: 74 mL/min/{1.73_m2} (ref >=60)

## 2022-01-05 LAB — RPR: RPR Ser Ql: NONREACTIVE

## 2022-01-05 LAB — T-HELPER CELL (CD4) - (RCID CLINIC ONLY)
CD4 % Helper T Cell: 38 % (ref 33–65)
CD4 T Cell Abs: 866 /uL (ref 400–1790)

## 2022-02-14 ENCOUNTER — Ambulatory Visit: Payer: Self-pay | Admitting: Internal Medicine

## 2022-02-23 ENCOUNTER — Encounter: Payer: Self-pay | Admitting: Internal Medicine

## 2022-02-23 ENCOUNTER — Ambulatory Visit (INDEPENDENT_AMBULATORY_CARE_PROVIDER_SITE_OTHER): Payer: Self-pay | Admitting: Internal Medicine

## 2022-02-23 ENCOUNTER — Other Ambulatory Visit: Payer: Self-pay

## 2022-02-23 VITALS — BP 116/76 | HR 76 | Temp 98.4°F | Wt 160.0 lb

## 2022-02-23 DIAGNOSIS — F419 Anxiety disorder, unspecified: Secondary | ICD-10-CM

## 2022-02-23 DIAGNOSIS — R1013 Epigastric pain: Secondary | ICD-10-CM

## 2022-02-23 DIAGNOSIS — B20 Human immunodeficiency virus [HIV] disease: Secondary | ICD-10-CM

## 2022-02-23 DIAGNOSIS — Z79899 Other long term (current) drug therapy: Secondary | ICD-10-CM

## 2022-02-23 MED ORDER — ENSURE PO LIQD: 237.0000 mL | Freq: Two times a day (BID) | ORAL | 11 refills | Status: DC

## 2022-02-23 MED ORDER — HYOSCYAMINE SULFATE ER 0.375 MG PO TB12: 0.3750 mg | ORAL_TABLET | Freq: Two times a day (BID) | ORAL | 0 refills | Status: DC

## 2022-02-23 MED ORDER — SERTRALINE HCL 100 MG PO TABS: 100.0000 mg | ORAL_TABLET | Freq: Every day | ORAL | 11 refills | Status: DC

## 2022-02-23 NOTE — Progress Notes (Signed)
RFV: follow up for hiv disease  Patient ID: Samantha Fleming, female   DOB: 05-24-1981, 41 y.o.   MRN: [id number]  HPI Samantha 41yo F with HIV disease, well controlled. CD 4 count of 866/VL<20 (jan 2023)  Bentyl didn't work.  "Stomach killing me" with anxiety. No improvement with pepto Has 2-3 BM a day- intermittent watery -- has episodic flares due to stress  Outpatient Encounter Medications as of 02/23/2022  Medication Sig   albuterol (VENTOLIN HFA) 108 (90 Base) MCG/ACT inhaler Inhale 2 puffs into the lungs every 6 (six) hours as needed for wheezing or shortness of breath.   bictegravir-emtricitabine-tenofovir AF (BIKTARVY) 50-200-25 MG TABS tablet Take 1 tablet by mouth daily.   buprenorphine (SUBUTEX) 2 MG SUBL SL tablet Place 10 mg under the tongue daily.   Ensure (ENSURE) Take 237 mLs by mouth 2 (two) times daily between meals. Dispense quantity for 1 month   gabapentin (NEURONTIN) 300 MG capsule TAKE 1 CAPSULE(300 MG) BY MOUTH TWICE DAILY   sertraline (ZOLOFT) 50 MG tablet Take 1 tablet (50 mg total) by mouth daily.   traZODone (DESYREL) 50 MG tablet Take 1 tablet (50 mg total) by mouth at bedtime as needed for sleep.   dicyclomine (BENTYL) 20 MG tablet Take 1 tablet (20 mg total) by mouth 4 (four) times daily -  before meals and at bedtime. As needed (Patient not taking: Reported on 02/23/2022)   No facility-administered encounter medications on file as of 02/23/2022.     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   Hepatitis C antibody test positive 04/29/2015   HIV disease (HCC) 04/29/2015   PTSD (post-traumatic stress disorder) 04/29/2015   Menorrhagia with irregular cycle 04/29/2015   GAD (generalized anxiety  disorder) 04/29/2015   History of intravenous drug use in remission 04/29/2015   Smoker 04/29/2015   Fibromyalgia 04/29/2015     Health Maintenance Due  Topic Date Due   COVID-19 Vaccine (1) Never done   TETANUS/TDAP  Never done   INFLUENZA VACCINE  07/25/2021     Review of Systems 12 point ros is negative except what is mentioned above Physical Exam   BP 116/76   Pulse 76   Temp 98.4 F (36.9 C) (Temporal)   Wt 160 lb (72.6 kg)   BMI 25.82 kg/m   Physical Exam  Constitutional:  oriented to person, place, and time. appears well-developed and well-nourished. No distress.  HENT: [place]/AT, PERRLA, no scleral icterus Mouth/Throat: Oropharynx is clear and moist. No oropharyngeal exudate.  Cardiovascular: Normal rate, regular rhythm and normal heart sounds. Exam reveals no gallop and no friction rub.  No murmur heard.  Pulmonary/Chest: Effort normal and breath sounds normal. No respiratory distress.  has no wheezes.  Neck = supple, no nuchal rigidity Abdominal: Soft. Bowel sounds are normal.  exhibits no distension. There is no tenderness.  Lymphadenopathy: no cervical adenopathy. No axillary adenopathy Neurological: alert and oriented to person, place, and time.  Skin: Skin is warm and dry. No rash noted. No erythema.  Psychiatric: a normal mood and affect.  behavior is normal.   Lab Results  Component Value Date   CD4TCELL 38 01/03/2022   Lab Results  Component Value Date   CD4TABS 866 01/03/2022   CD4TABS  768 05/24/2021   CD4TABS 460 01/15/2019   Lab Results  Component Value Date   HIV1RNAQUANT Not Detected 01/03/2022   Lab Results  Component Value Date   HEPBSAB POS (A) 04/14/2015   Lab Results  Component Value Date   LABRPR NON-REACTIVE 01/03/2022    CBC Lab Results  Component Value Date   WBC 7.2 01/03/2022   RBC 4.33 01/03/2022   HGB 13.6 01/03/2022   HCT 41.3 01/03/2022   PLT 205 01/03/2022   MCV 95.4 01/03/2022   MCH 31.4 01/03/2022   MCHC 32.9  01/03/2022   RDW 12.9 01/03/2022   LYMPHSABS 2,340 01/03/2022   MONOABS 384 05/31/2017   EOSABS 137 01/03/2022    BMET Lab Results  Component Value Date   NA 138 01/03/2022   K 4.2 01/03/2022   CL 105 01/03/2022   CO2 27 01/03/2022   GLUCOSE 80 01/03/2022   BUN 11 01/03/2022   CREATININE 0.99 (H) 01/03/2022   CALCIUM 9.1 01/03/2022   GFRNONAA 82 05/24/2021   GFRAA 95 05/24/2021      Assessment and Plan HIV disease= will plan to check labs to see she is undetectable. Plan to refill biktarvy  Long term medication management = will check cr function to see at baseline  Abdominal pain/diarrhea= wondering if she has IBS. Can do trial of bentyl  Anxiety = can do atarax

## 2022-02-24 LAB — HEMOGLOBIN A1C
Hgb A1c MFr Bld: 4.9 % of total Hgb (ref <5.7)
Mean Plasma Glucose: 94 mg/dL
eAG (mmol/L): 5.2 mmol/L

## 2022-04-05 ENCOUNTER — Other Ambulatory Visit: Payer: Self-pay | Admitting: Internal Medicine

## 2022-04-06 ENCOUNTER — Telehealth: Payer: Self-pay

## 2022-04-06 DIAGNOSIS — G59 Mononeuropathy in diseases classified elsewhere: Secondary | ICD-10-CM

## 2022-04-06 NOTE — Telephone Encounter (Signed)
Patient called requesting refills of gabapentin and trazodone. Advised that trazodone should have refills on file. Message sent to Dr. Baxter Flattery to see if okay to refill gabapentin.  ? ?Beryle Flock, RN ? ?

## 2022-04-06 NOTE — Telephone Encounter (Signed)
Please advise if okay to refill. 

## 2022-04-07 MED ORDER — GABAPENTIN 300 MG PO CAPS: ORAL_CAPSULE | ORAL | 2 refills | Status: DC

## 2022-04-07 NOTE — Addendum Note (Signed)
Addended by: Lucie Leather D on: 04/07/2022 08:51 AM ? ? Modules accepted: Orders ? ?

## 2022-04-07 NOTE — Telephone Encounter (Signed)
Okay to refill gabapentin per Dr. Drue Second. Refills sent.  ? ?Sandie Ano, RN ? ?

## 2022-05-18 ENCOUNTER — Ambulatory Visit: Payer: Self-pay | Admitting: Internal Medicine

## 2022-05-25 ENCOUNTER — Ambulatory Visit (INDEPENDENT_AMBULATORY_CARE_PROVIDER_SITE_OTHER): Payer: Self-pay | Admitting: Internal Medicine

## 2022-05-25 ENCOUNTER — Other Ambulatory Visit: Payer: Self-pay

## 2022-05-25 ENCOUNTER — Encounter: Payer: Self-pay | Admitting: Internal Medicine

## 2022-05-25 VITALS — BP 110/72 | HR 79 | Resp 16 | Ht 66.0 in | Wt 152.0 lb

## 2022-05-25 DIAGNOSIS — F419 Anxiety disorder, unspecified: Secondary | ICD-10-CM

## 2022-05-25 DIAGNOSIS — B20 Human immunodeficiency virus [HIV] disease: Secondary | ICD-10-CM

## 2022-05-25 DIAGNOSIS — F5101 Primary insomnia: Secondary | ICD-10-CM

## 2022-05-25 DIAGNOSIS — G59 Mononeuropathy in diseases classified elsewhere: Secondary | ICD-10-CM

## 2022-05-25 MED ORDER — MIRTAZAPINE 15 MG PO TABS: 15.0000 mg | ORAL_TABLET | Freq: Every day | ORAL | 3 refills | Status: DC

## 2022-05-25 MED ORDER — METHOCARBAMOL 750 MG PO TABS: 750.0000 mg | ORAL_TABLET | Freq: Three times a day (TID) | ORAL | 0 refills | Status: DC

## 2022-05-25 MED ORDER — GABAPENTIN 300 MG PO CAPS: 300.0000 mg | ORAL_CAPSULE | Freq: Three times a day (TID) | ORAL | 4 refills | Status: DC

## 2022-05-25 NOTE — Progress Notes (Signed)
RFV: follow up for hiv disease Patient ID: Samantha Fleming, female   DOB: 1981/08/01, 41 y.o.   MRN: [id number]  HPI Aleksis is a 41yo F with hiv disease currently on biktarvy, she reports still having difficulty with insomnia Having too vivid dreams of trazodone wants to try different agent. She is also still very committed to not using illicit drugs. She is continuing with her program for opiate replacement - current clinic she is spending $20 a day on subutex. She feels that still has neuropathy, slightly improved with neurontin. Anxiety appears improving.  Outpatient Encounter Medications as of 05/25/2022  Medication Sig   bictegravir-emtricitabine-tenofovir AF (BIKTARVY) 50-200-25 MG TABS tablet Take 1 tablet by mouth daily.   buprenorphine (SUBUTEX) 2 MG SUBL SL tablet Place 10 mg under the tongue daily.   Ensure (ENSURE) Take 237 mLs by mouth 2 (two) times daily between meals. Dispense quantity for 3 month   gabapentin (NEURONTIN) 300 MG capsule TAKE 1 CAPSULE(300 MG) BY MOUTH TWICE DAILY   sertraline (ZOLOFT) 50 MG tablet Take 50 mg by mouth daily.   traZODone (DESYREL) 50 MG tablet Take 1 tablet (50 mg total) by mouth at bedtime as needed for sleep.   albuterol (VENTOLIN HFA) 108 (90 Base) MCG/ACT inhaler Inhale 2 puffs into the lungs every 6 (six) hours as needed for wheezing or shortness of breath. (Patient not taking: Reported on 05/25/2022)   hyoscyamine (LEVBID) 0.375 MG 12 hr tablet Take 1 tablet (0.375 mg total) by mouth 2 (two) times daily. (Patient not taking: Reported on 05/25/2022)   [DISCONTINUED] sertraline (ZOLOFT) 100 MG tablet Take 1 tablet (100 mg total) by mouth daily.   No facility-administered encounter medications on file as of 05/25/2022.     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 appears well-developed and well-nourished. No distress.  HENT: [place]/AT, PERRLA, no scleral icterus Mouth/Throat: Oropharynx is clear and moist. No oropharyngeal exudate.  Cardiovascular: Normal rate, regular rhythm and normal heart sounds. Exam reveals no gallop and no friction rub.  No murmur heard.  Pulmonary/Chest: Effort normal and breath sounds normal. No respiratory distress.  has no wheezes.  Neck = supple, no nuchal rigidity Lymphadenopathy: no cervical adenopathy. No axillary adenopathy Neurological: alert and oriented to person, place, and time.  Skin: Skin is warm and dry. No rash noted. No erythema.  Psychiatric: a normal  mood and affect.  behavior is normal.   Lab Results  Component Value Date   CD4TCELL 38 01/03/2022   Lab Results  Component Value Date   CD4TABS 866 01/03/2022   CD4TABS 768 05/24/2021   CD4TABS 460 01/15/2019   Lab Results  Component Value Date   HIV1RNAQUANT Not Detected 01/03/2022   Lab Results  Component Value Date   HEPBSAB POS (A) 04/14/2015   Lab Results  Component Value Date   LABRPR NON-REACTIVE 01/03/2022     CBC Lab Results  Component Value Date   WBC 7.2 01/03/2022   RBC 4.33 01/03/2022   HGB 13.6 01/03/2022   HCT 41.3 01/03/2022   PLT 205 01/03/2022   MCV 95.4 01/03/2022   MCH 31.4 01/03/2022   MCHC 32.9 01/03/2022   RDW 12.9 01/03/2022   LYMPHSABS 2,340 01/03/2022   MONOABS 384 05/31/2017   EOSABS 137 01/03/2022    BMET Lab Results  Component Value Date   NA 138 01/03/2022   K 4.2 01/03/2022   CL 105 01/03/2022   CO2 27 01/03/2022   GLUCOSE 80 01/03/2022   BUN 11 01/03/2022   CREATININE 0.99 (H) 01/03/2022   CALCIUM 9.1 01/03/2022   GFRNONAA 82 05/24/2021   GFRAA 95 05/24/2021      Assessment and Plan HIV disease= well controlled. Continue with biktarvy and give refills if needed Anxiety /insomnia = Will try adding mirtazipine to help with anxiety and sleep instead of trazodone  Neuropathy = increase gabapentin 300mg  tid instead of bid to see if better symptoms  Msk pain = robaxin trial to avoid any opiates  ?subutex = she has high out of pocket cost, but not sure if we have capacity to dispense through clinic. Will look into it to see if can dispense to her.   Touch base in 2 wks

## 2022-05-26 LAB — T-HELPER CELL (CD4) - (RCID CLINIC ONLY)
CD4 % Helper T Cell: 37 % (ref 33–65)
CD4 T Cell Abs: 677 /uL (ref 400–1790)

## 2022-05-29 LAB — CBC WITH DIFFERENTIAL/PLATELET
Absolute Monocytes: 608 cells/uL (ref 200–950)
Basophils Absolute: 122 cells/uL (ref 0–200)
Basophils Relative: 1.5 %
Eosinophils Absolute: 162 cells/uL (ref 15–500)
Eosinophils Relative: 2 %
HCT: 41.2 % (ref 35.0–45.0)
Hemoglobin: 14 g/dL (ref 11.7–15.5)
Lymphs Abs: 2357 cells/uL (ref 850–3900)
MCH: 32.5 pg (ref 27.0–33.0)
MCHC: 34 g/dL (ref 32.0–36.0)
MCV: 95.6 fL (ref 80.0–100.0)
MPV: 11.5 fL (ref 7.5–12.5)
Monocytes Relative: 7.5 %
Neutro Abs: 4852 cells/uL (ref 1500–7800)
Neutrophils Relative %: 59.9 %
Platelets: 201 10*3/uL (ref 140–400)
RBC: 4.31 10*6/uL (ref 3.80–5.10)
RDW: 12.1 % (ref 11.0–15.0)
Total Lymphocyte: 29.1 %
WBC: 8.1 10*3/uL (ref 3.8–10.8)

## 2022-05-29 LAB — TSH+FREE T4: TSH W/REFLEX TO FT4: 2.58 mIU/L

## 2022-05-29 LAB — COMPLETE METABOLIC PANEL WITH GFR
AG Ratio: 2.1 (calc) (ref 1.0–2.5)
ALT: 7 U/L (ref 6–29)
AST: 14 U/L (ref 10–30)
Albumin: 4.6 g/dL (ref 3.6–5.1)
Alkaline phosphatase (APISO): 41 U/L (ref 31–125)
BUN: 17 mg/dL (ref 7–25)
CO2: 23 mmol/L (ref 20–32)
Calcium: 9.5 mg/dL (ref 8.6–10.2)
Chloride: 106 mmol/L (ref 98–110)
Creat: 0.96 mg/dL (ref 0.50–0.97)
Globulin: 2.2 g/dL (calc) (ref 1.9–3.7)
Glucose, Bld: 89 mg/dL (ref 65–99)
Potassium: 4.3 mmol/L (ref 3.5–5.3)
Sodium: 136 mmol/L (ref 135–146)
Total Bilirubin: 0.6 mg/dL (ref 0.2–1.2)
Total Protein: 6.8 g/dL (ref 6.1–8.1)
eGFR: 77 mL/min/{1.73_m2} (ref >=60)

## 2022-05-29 LAB — HIV-1 RNA QUANT-NO REFLEX-BLD
HIV 1 RNA Quant: NOT DETECTED Copies/mL
HIV-1 RNA Quant, Log: NOT DETECTED Log cps/mL

## 2022-05-29 LAB — RPR: RPR Ser Ql: NONREACTIVE

## 2022-06-12 ENCOUNTER — Ambulatory Visit (INDEPENDENT_AMBULATORY_CARE_PROVIDER_SITE_OTHER): Payer: Self-pay | Admitting: Internal Medicine

## 2022-06-12 ENCOUNTER — Other Ambulatory Visit: Payer: Self-pay

## 2022-06-12 DIAGNOSIS — F419 Anxiety disorder, unspecified: Secondary | ICD-10-CM

## 2022-06-12 DIAGNOSIS — G59 Mononeuropathy in diseases classified elsewhere: Secondary | ICD-10-CM

## 2022-06-12 DIAGNOSIS — F5101 Primary insomnia: Secondary | ICD-10-CM

## 2022-06-12 DIAGNOSIS — B20 Human immunodeficiency virus [HIV] disease: Secondary | ICD-10-CM

## 2022-06-12 NOTE — Progress Notes (Unsigned)
Virtual Visit via Telephone Note  I connected with Samantha Fleming on 06/12/22 at 10:45 AM EDT by telephone and verified that I am speaking with the correct person using two identifiers.  Location: Patient: at home Provider: at clinic   I discussed the limitations, risks, security and privacy concerns of performing an evaluation and management service by telephone and the availability of in person appointments. I also discussed with the patient that there may be a patient responsible charge related to this service. The patient expressed understanding and agreed to proceed.   History of Present Illness: She reports that remeron for sleep was too sedating. She is no longer taking it. Having improvement with neuropathy with TID neurontin.    Observations/Objective:fluid speech, not pressured. Coherent answering questions appropriately   Assessment and Plan:  She is wondering if can do meloxicam. But has severe allergy to aspirin  Hiv disease =continue with ART Keep tid neurontin 300 Stop remeron- too sedating Follow Up Instructions:   see abovel I discussed the assessment and treatment plan with the patient. The patient was provided an opportunity to ask questions and all were answered. The patient agreed with the plan and demonstrated an understanding of the instructions.   The patient was advised to call back or seek an in-person evaluation if the symptoms worsen or if the condition fails to improve as anticipated.  I provided 15 minutes of non-face-to-face time during this encounter.   [name], MD

## 2022-08-28 ENCOUNTER — Other Ambulatory Visit: Payer: Self-pay | Admitting: Internal Medicine

## 2022-08-28 DIAGNOSIS — F419 Anxiety disorder, unspecified: Secondary | ICD-10-CM

## 2022-08-28 DIAGNOSIS — F5101 Primary insomnia: Secondary | ICD-10-CM

## 2022-10-10 ENCOUNTER — Ambulatory Visit: Payer: Self-pay

## 2022-10-17 ENCOUNTER — Other Ambulatory Visit: Payer: Self-pay

## 2022-10-17 ENCOUNTER — Ambulatory Visit: Payer: Self-pay

## 2022-10-17 DIAGNOSIS — Z113 Encounter for screening for infections with a predominantly sexual mode of transmission: Secondary | ICD-10-CM

## 2022-10-17 DIAGNOSIS — Z79899 Other long term (current) drug therapy: Secondary | ICD-10-CM

## 2022-10-17 DIAGNOSIS — B20 Human immunodeficiency virus [HIV] disease: Secondary | ICD-10-CM

## 2022-10-18 LAB — URINE CYTOLOGY ANCILLARY ONLY
Chlamydia: NEGATIVE
Comment: NEGATIVE
Comment: NORMAL
Neisseria Gonorrhea: NEGATIVE

## 2022-10-18 LAB — T-HELPER CELL (CD4) - (RCID CLINIC ONLY)
CD4 % Helper T Cell: 39 % (ref 33–65)
CD4 T Cell Abs: 793 /uL (ref 400–1790)

## 2022-10-19 LAB — COMPLETE METABOLIC PANEL WITH GFR
AG Ratio: 1.7 (calc) (ref 1.0–2.5)
ALT: 9 U/L (ref 6–29)
AST: 14 U/L (ref 10–30)
Albumin: 4.7 g/dL (ref 3.6–5.1)
Alkaline phosphatase (APISO): 52 U/L (ref 31–125)
BUN: 15 mg/dL (ref 7–25)
CO2: 25 mmol/L (ref 20–32)
Calcium: 9.3 mg/dL (ref 8.6–10.2)
Chloride: 107 mmol/L (ref 98–110)
Creat: 0.91 mg/dL (ref 0.50–0.99)
Globulin: 2.7 g/dL (calc) (ref 1.9–3.7)
Glucose, Bld: 87 mg/dL (ref 65–99)
Potassium: 4 mmol/L (ref 3.5–5.3)
Sodium: 139 mmol/L (ref 135–146)
Total Bilirubin: 0.3 mg/dL (ref 0.2–1.2)
Total Protein: 7.4 g/dL (ref 6.1–8.1)
eGFR: 82 mL/min/{1.73_m2} (ref >=60)

## 2022-10-19 LAB — CBC WITH DIFFERENTIAL/PLATELET
Absolute Monocytes: 383 cells/uL (ref 200–950)
Basophils Absolute: 108 cells/uL (ref 0–200)
Basophils Relative: 2 %
Eosinophils Absolute: 211 cells/uL (ref 15–500)
Eosinophils Relative: 3.9 %
HCT: 41.1 % (ref 35.0–45.0)
Hemoglobin: 14.1 g/dL (ref 11.7–15.5)
Lymphs Abs: 2214 cells/uL (ref 850–3900)
MCH: 32.8 pg (ref 27.0–33.0)
MCHC: 34.3 g/dL (ref 32.0–36.0)
MCV: 95.6 fL (ref 80.0–100.0)
MPV: 11.4 fL (ref 7.5–12.5)
Monocytes Relative: 7.1 %
Neutro Abs: 2484 cells/uL (ref 1500–7800)
Neutrophils Relative %: 46 %
Platelets: 242 10*3/uL (ref 140–400)
RBC: 4.3 10*6/uL (ref 3.80–5.10)
RDW: 12.3 % (ref 11.0–15.0)
Total Lymphocyte: 41 %
WBC: 5.4 10*3/uL (ref 3.8–10.8)

## 2022-10-19 LAB — HIV-1 RNA QUANT-NO REFLEX-BLD
HIV 1 RNA Quant: NOT DETECTED Copies/mL
HIV-1 RNA Quant, Log: NOT DETECTED Log cps/mL

## 2022-10-19 LAB — LIPID PANEL
Cholesterol: 206 mg/dL — ABNORMAL HIGH (ref <200)
HDL: 50 mg/dL (ref >=50)
LDL Cholesterol (Calc): 125 mg/dL (calc) — ABNORMAL HIGH
Non-HDL Cholesterol (Calc): 156 mg/dL (calc) — ABNORMAL HIGH (ref <130)
Total CHOL/HDL Ratio: 4.1 (calc) (ref <5.0)
Triglycerides: 190 mg/dL — ABNORMAL HIGH (ref <150)

## 2022-10-19 LAB — RPR: RPR Ser Ql: NONREACTIVE

## 2022-10-23 ENCOUNTER — Other Ambulatory Visit: Payer: Self-pay | Admitting: Internal Medicine

## 2022-10-23 DIAGNOSIS — G59 Mononeuropathy in diseases classified elsewhere: Secondary | ICD-10-CM

## 2022-10-23 NOTE — Telephone Encounter (Signed)
Patient has appointment on 10/31

## 2022-10-24 ENCOUNTER — Ambulatory Visit (INDEPENDENT_AMBULATORY_CARE_PROVIDER_SITE_OTHER): Payer: Self-pay | Admitting: Internal Medicine

## 2022-10-24 ENCOUNTER — Encounter: Payer: Self-pay | Admitting: Internal Medicine

## 2022-10-24 ENCOUNTER — Ambulatory Visit: Payer: Self-pay

## 2022-10-24 ENCOUNTER — Other Ambulatory Visit: Payer: Self-pay

## 2022-10-24 VITALS — BP 142/100 | HR 101 | Temp 97.9°F | Wt 173.0 lb

## 2022-10-24 DIAGNOSIS — G59 Mononeuropathy in diseases classified elsewhere: Secondary | ICD-10-CM

## 2022-10-24 DIAGNOSIS — F419 Anxiety disorder, unspecified: Secondary | ICD-10-CM

## 2022-10-24 DIAGNOSIS — B20 Human immunodeficiency virus [HIV] disease: Secondary | ICD-10-CM

## 2022-10-24 MED ORDER — PREGABALIN 75 MG PO CAPS: 75.0000 mg | ORAL_CAPSULE | Freq: Two times a day (BID) | ORAL | 3 refills | Status: DC

## 2022-10-24 MED ORDER — CLONAZEPAM 1 MG PO TABS: 1.0000 mg | ORAL_TABLET | Freq: Two times a day (BID) | ORAL | 0 refills | Status: DC | PRN

## 2022-10-24 MED ORDER — MELOXICAM 7.5 MG PO TABS: 7.5000 mg | ORAL_TABLET | Freq: Every day | ORAL | 0 refills | Status: DC

## 2022-10-24 NOTE — Progress Notes (Unsigned)
RFV: follow up for hiv disease  Patient ID: Samantha Fleming, female   DOB: 06-22-1981, 41 y.o.   MRN: [id number]  Willard continues to do well with hiv disease, on biktarvy, undetectable. Has been following with counselor and provider (dr Clovis Riley) for subutex and counselor for anxiety. Continues zoloft and subutex.  Reconnected with mom, and concern about parallel with fibromyalgia. Fatigue +  Having dreams/PTSD  - \to encourage non-pharmaceutical process started   Outpatient Encounter Medications as of 10/24/2022  Medication Sig   bictegravir-emtricitabine-tenofovir AF (BIKTARVY) 50-200-25 MG TABS tablet Take 1 tablet by mouth daily.   buprenorphine (SUBUTEX) 2 MG SUBL SL tablet Place 10 mg under the tongue daily.   Ensure (ENSURE) Take 237 mLs by mouth 2 (two) times daily between meals. Dispense quantity for 3 month   gabapentin (NEURONTIN) 300 MG capsule Take 1 capsule (300 mg total) by mouth 3 (three) times daily.   sertraline (ZOLOFT) 50 MG tablet Take 50 mg by mouth daily.   albuterol (VENTOLIN HFA) 108 (90 Base) MCG/ACT inhaler Inhale 2 puffs into the lungs every 6 (six) hours as needed for wheezing or shortness of breath. (Patient not taking: Reported on 05/25/2022)   hyoscyamine (LEVBID) 0.375 MG 12 hr tablet Take 1 tablet (0.375 mg total) by mouth 2 (two) times daily. (Patient not taking: Reported on 05/25/2022)   methocarbamol (ROBAXIN-750) 750 MG tablet Take 1 tablet (750 mg total) by mouth 3 (three) times daily. (Patient not taking: Reported on 10/24/2022)   No facility-administered encounter medications on file as of 10/24/2022.     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   Smoker 04/29/2015   Fibromyalgia 04/29/2015     Health Maintenance Due  Topic Date Due   TETANUS/TDAP  Never done   PAP SMEAR-Modifier  05/19/2022     Review of Systems  Physical Exam   BP (!) 142/100   Pulse (!) 101   Temp 97.9 F (36.6 C) (Temporal)   Wt 173 lb (78.5 kg)   SpO2 96%   BMI 27.92 kg/m    Lab Results  Component Value Date   CD4TCELL 39 10/17/2022   Lab Results  Component Value Date   CD4TABS 793 10/17/2022   CD4TABS 677 05/25/2022   CD4TABS 866 01/03/2022   Lab Results  Component Value Date   HIV1RNAQUANT Not Detected 10/17/2022   Lab Results  Component Value Date   HEPBSAB POS (A) 04/14/2015   Lab Results  Component Value Date   LABRPR NON-REACTIVE 10/17/2022    CBC Lab Results  Component Value Date   WBC 5.4 10/17/2022   RBC 4.30 10/17/2022   HGB 14.1 10/17/2022   HCT 41.1 10/17/2022   PLT 242 10/17/2022   MCV 95.6 10/17/2022   MCH 32.8 10/17/2022   MCHC 34.3 10/17/2022   RDW 12.3 10/17/2022   LYMPHSABS 2,214 10/17/2022   MONOABS 384 05/31/2017   EOSABS 211 10/17/2022    BMET Lab Results  Component Value  Date   NA 139 10/17/2022   K 4.0 10/17/2022   CL 107 10/17/2022   CO2 25 10/17/2022   GLUCOSE 87 10/17/2022   BUN 15 10/17/2022   CREATININE 0.91 10/17/2022   CALCIUM 9.3 10/17/2022   GFRNONAA 82 05/24/2021   GFRAA 95 05/24/2021      Assessment and Plan  - gabapentin not working. Will change to lyrica trial - needs psychiatrist, in the meantime will give klonopin 1mg  BID PRN to use if having panic attack only dispensing #30, and have it last  2 months with intent for her to continue with other provider  We will see back in 2 months

## 2022-10-24 NOTE — Patient Instructions (Signed)
Outpatient behavioral health [phone]

## 2022-11-17 ENCOUNTER — Other Ambulatory Visit: Payer: Self-pay | Admitting: Internal Medicine

## 2022-11-17 DIAGNOSIS — B20 Human immunodeficiency virus [HIV] disease: Secondary | ICD-10-CM

## 2022-12-07 ENCOUNTER — Other Ambulatory Visit: Payer: Self-pay

## 2022-12-07 ENCOUNTER — Emergency Department (HOSPITAL_COMMUNITY): Payer: Self-pay

## 2022-12-07 ENCOUNTER — Encounter (HOSPITAL_COMMUNITY): Payer: Self-pay | Admitting: Emergency Medicine

## 2022-12-07 ENCOUNTER — Emergency Department (HOSPITAL_COMMUNITY)
Admission: EM | Admit: 2022-12-07 | Discharge: 2022-12-07 | Disposition: A | Discharge status: Home / Self Care | Payer: Self-pay | Attending: Emergency Medicine | Admitting: Emergency Medicine

## 2022-12-07 DIAGNOSIS — W010XXA Fall on same level from slipping, tripping and stumbling without subsequent striking against object, initial encounter: Secondary | ICD-10-CM | POA: Insufficient documentation

## 2022-12-07 DIAGNOSIS — M25561 Pain in right knee: Secondary | ICD-10-CM | POA: Insufficient documentation

## 2022-12-07 DIAGNOSIS — Y9389 Activity, other specified: Secondary | ICD-10-CM | POA: Insufficient documentation

## 2022-12-07 DIAGNOSIS — Z21 Asymptomatic human immunodeficiency virus [HIV] infection status: Secondary | ICD-10-CM | POA: Insufficient documentation

## 2022-12-07 MED ORDER — CYCLOBENZAPRINE HCL 10 MG PO TABS: 10.0000 mg | ORAL_TABLET | Freq: Two times a day (BID) | ORAL | 0 refills | Status: DC | PRN

## 2022-12-07 MED ORDER — OXYCODONE-ACETAMINOPHEN 5-325 MG PO TABS
1.0000 | ORAL_TABLET | Freq: Once | ORAL | Status: AC
  Administered 2022-12-07: 1 via ORAL
  Filled 2022-12-07: qty 1

## 2022-12-07 NOTE — Discharge Instructions (Addendum)
The workup today was overall reassuring.  As discussed, continue symptomatic therapy at home with rest, ice, elevation, Tylenol.  Recommend follow-up with orthopedics outpatient for reevaluation of your symptoms.  Take muscle relaxer as needed; make sure not to drive while taking this medication as it can cause drowsiness.  Please do not hesitate to return to emergency department for worrisome signs and symptoms we discussed become apparent.

## 2022-12-07 NOTE — ED Triage Notes (Signed)
Pt fell x 5 days ago. C/o right knee pain since. Nad. Pt has moderate swelling just above knee all the way down to foot. No redness noted. No increased warmth noted.

## 2022-12-07 NOTE — ED Provider Notes (Signed)
[facility] EMERGENCY DEPARTMENT Provider Note   CSN: [id number] Arrival date & time: 12/07/22  1825     History  Chief Complaint  Patient presents with   Knee Pain    Jasmyn Dicostanzo is a 41 y.o. female.   Knee Pain   41 year old female presents emergency department with complaints of right knee pain.  Patient states that she was helping to move materials from a trailer 5 days ago when she lost her balance and her right lower leg went outward causing her knee to buckle.  She states that since then, her knee has been feeling unstable and she has had subsequent swelling in her right lower extremity.  Denies any repeat trauma, weakness/sensory deficits infected leg.  Has taken ibuprofen at home which has helped some.  Presents emergency department for further evaluation.  Past medical history significant for fibromyalgia, neuromuscular disorder, generalized anxiety disorder, HIV  Home Medications Prior to Admission medications   Medication Sig Start Date End Date Taking? Authorizing Provider  cyclobenzaprine (FLEXERIL) 10 MG tablet Take 1 tablet (10 mg total) by mouth 2 (two) times daily as needed for muscle spasms. 12/07/22  Yes Sherian Maroon A, PA  albuterol (VENTOLIN HFA) 108 (90 Base) MCG/ACT inhaler Inhale 2 puffs into the lungs every 6 (six) hours as needed for wheezing or shortness of breath. Patient not taking: Reported on 05/25/2022 01/03/22   [name], MD  bictegravir-emtricitabine-tenofovir AF (BIKTARVY) 50-200-25 MG TABS tablet TAKE 1 TABLET BY MOUTH DAILY 11/20/22   [name], MD  buprenorphine (SUBUTEX) 2 MG SUBL SL tablet Place 10 mg under the tongue daily.    [provider]  clonazePAM (KLONOPIN) 1 MG tablet Take 1 tablet (1 mg total) by mouth 2 (two) times daily as needed for anxiety. 10/24/22   [name], MD  Ensure (ENSURE) Take 237 mLs by mouth 2 (two) times daily between meals. Dispense quantity for 3 month 02/23/22   [name], MD   meloxicam (MOBIC) 7.5 MG tablet Take 1 tablet (7.5 mg total) by mouth daily. 10/24/22   [name], MD  pregabalin (LYRICA) 75 MG capsule Take 1 capsule (75 mg total) by mouth 2 (two) times daily. 10/24/22   [name], MD  sertraline (ZOLOFT) 50 MG tablet Take 50 mg by mouth daily. 02/28/22   [provider]      Allergies    Asa [aspirin] and Rilpivirine    Review of Systems   Review of Systems  All other systems reviewed and are negative.   Physical Exam Updated Vital Signs BP 113/71 (BP Location: Right Arm)   Pulse 73   Temp 97.6 F (36.4 C) (Oral)   Resp 16   LMP 12/01/2022   SpO2 96%  Physical Exam Vitals and nursing note reviewed.  Constitutional:      General: She is not in acute distress.    Appearance: She is well-developed.  HENT:     Head: Normocephalic and atraumatic.  Eyes:     Conjunctiva/sclera: Conjunctivae normal.  Cardiovascular:     Rate and Rhythm: Normal rate and regular rhythm.     Heart sounds: No murmur heard. Pulmonary:     Effort: Pulmonary effort is normal. No respiratory distress.     Breath sounds: Normal breath sounds.  Abdominal:     Palpations: Abdomen is soft.     Tenderness: There is no abdominal tenderness.  Musculoskeletal:        General: No swelling.     Cervical back:  Neck supple.     Comments: 01+ pitting edema bilateral lower extremities.  Swelling noted around right knee most impressively.  No overlying skin abnormalities noted including erythema, induration tissue, areas of fluctuation.  Patient with pain when flexing extended right knee as well as with palpable crepitus.  Pedal pulses full and intact bilaterally.  Patient has lateral and medial joint line tenderness right knee.  No obvious joint instability appreciated in anterior/posterior drawer, medial/lateral stress test as well as McMurray; test negative joint stability limited secondary to body habitus and patient compliance.  Patient complaining no  sensory deficits along major nerve distributions of the lower extremities.  Compartments soft and supple.  Skin:    General: Skin is warm and dry.     Capillary Refill: Capillary refill takes less than 2 seconds.  Neurological:     Mental Status: She is alert.  Psychiatric:        Mood and Affect: Mood normal.     ED Results / Procedures / Treatments   Labs (all labs ordered are listed, but only abnormal results are displayed) Labs Reviewed - No data to display  EKG None  Radiology DG Knee Complete 4 Views Right  Result Date: 12/07/2022 CLINICAL DATA:  Larey Seat 5 days ago, right knee pain EXAM: RIGHT KNEE - COMPLETE 4+ VIEW COMPARISON:  None Available. FINDINGS: Frontal, bilateral oblique, lateral views of the right knee are obtained. No fracture, subluxation, or dislocation. Joint spaces are well preserved. No joint effusion. Soft tissues are unremarkable. IMPRESSION: 1. Unremarkable right knee. Electronically Signed   By: Sharlet Salina M.D.   On: 12/07/2022 18:56    Procedures Procedures    Medications Ordered in ED Medications  oxyCODONE-acetaminophen (PERCOCET/ROXICET) 5-325 MG per tablet 1 tablet (1 tablet Oral Given 12/07/22 2040)    ED Course/ Medical Decision Making/ A&P                           Medical Decision Making Amount and/or Complexity of Data Reviewed Radiology: ordered.  Risk Prescription drug management.   This patient presents to the ED for concern of knee pain, this involves an extensive number of treatment options, and is a complaint that carries with it a high risk of complications and morbidity.  The differential diagnosis includes osteoarthritis, rheumatoid arthritis, septic arthritis, Reiter's syndrome, fracture, dislocation, strain/sprain, ligamentous/tendinous injury, neurovascular compromise, compartment syndrome   Co morbidities that complicate the patient evaluation  See HPI   Additional history obtained:  Additional history obtained  from EMR External records from outside source obtained and reviewed including hospital records   Lab Tests:  N/a   Imaging Studies ordered:  I ordered imaging studies including right knee x-ray I independently visualized and interpreted imaging which showed no acute abnormalities I agree with the radiologist interpretation   Cardiac Monitoring: / EKG:  The patient was maintained on a cardiac monitor.  I personally viewed and interpreted the cardiac monitored which showed an underlying rhythm of: Sinus rhythm   Consultations Obtained:  N/a   Problem List / ED Course / Critical interventions / Medication management  Right knee pain I ordered medication including oxycodone for pain   Reevaluation of the patient after these medicines showed that the patient improved I have reviewed the patients home medicines and have made adjustments as needed   Social Determinants of Health:  Chronic cigarette use.  Denies illicit drug use.  Test / Admission - Considered:  Right knee  pain Vitals signs within normal range and stable throughout visit. Imaging studies significant for: See above No acute fracture/dislocation appreciated on imaging study as indicated above.  No evidence of ischemic limb or compartment syndrome.  Concern for possible meniscal or ligamentous injury of right knee given subjective history although difficult to assess on physical exam as indicated above.  Patient placed in knee immobilizer and given crutches to help aid in ambulation.  Recommend symptomatic therapy at home with rest, ice, elevation as well as NSAID therapy.  Recommend follow-up with orthopedics outpatient for reevaluation of symptoms.  Treatment plan discussed with patient and she acknowledged understanding was agreeable to said plan. Worrisome signs and symptoms were discussed with the patient, and the patient acknowledged understanding to return to the ED if noticed. Patient was stable upon  discharge.          Final Clinical Impression(s) / ED Diagnoses Final diagnoses:  Acute pain of right knee    Rx / DC Orders ED Discharge Orders          Ordered    cyclobenzaprine (FLEXERIL) 10 MG tablet  2 times daily PRN        12/07/22 2028              Peter Garter, [place] 12/07/22 2127    Jacalyn Lefevre, MD 12/07/22 2247

## 2022-12-28 ENCOUNTER — Ambulatory Visit (INDEPENDENT_AMBULATORY_CARE_PROVIDER_SITE_OTHER): Payer: Medicaid Other | Admitting: Internal Medicine

## 2022-12-28 ENCOUNTER — Other Ambulatory Visit: Payer: Self-pay

## 2022-12-28 DIAGNOSIS — Z79899 Other long term (current) drug therapy: Secondary | ICD-10-CM | POA: Diagnosis not present

## 2022-12-28 DIAGNOSIS — B20 Human immunodeficiency virus [HIV] disease: Secondary | ICD-10-CM

## 2022-12-28 DIAGNOSIS — F419 Anxiety disorder, unspecified: Secondary | ICD-10-CM | POA: Diagnosis not present

## 2022-12-28 DIAGNOSIS — M797 Fibromyalgia: Secondary | ICD-10-CM

## 2022-12-28 DIAGNOSIS — F5101 Primary insomnia: Secondary | ICD-10-CM

## 2022-12-28 MED ORDER — GABAPENTIN 300 MG PO CAPS: 300.0000 mg | ORAL_CAPSULE | Freq: Three times a day (TID) | ORAL | 3 refills | Status: DC

## 2022-12-28 MED ORDER — SERTRALINE HCL 100 MG PO TABS: 100.0000 mg | ORAL_TABLET | Freq: Every day | ORAL | 11 refills | Status: AC

## 2022-12-28 MED ORDER — CLONAZEPAM 1 MG PO TABS: 1.0000 mg | ORAL_TABLET | Freq: Two times a day (BID) | ORAL | 0 refills | Status: DC | PRN

## 2022-12-28 MED ORDER — MIRTAZAPINE 7.5 MG PO TABS: 7.5000 mg | ORAL_TABLET | Freq: Every day | ORAL | 1 refills | Status: DC

## 2022-12-28 NOTE — Progress Notes (Signed)
Virtual Visit via Telephone Note  I connected with Samantha Fleming on 12/28/22 at  4:00 PM EST by telephone and verified that I am speaking with the correct person using two identifiers.  Location: Patient: at home Provider: in clinic   I discussed the limitations, risks, security and privacy concerns of performing an evaluation and management service by telephone and the availability of in person appointments. I also discussed with the patient that there may be a patient responsible charge related to this service. The patient expressed understanding and agreed to proceed.   History of Present Illness: She continues to follow up with subutex and counselor. She is currently taking zoloft 100mg  daily plus occ klonopin for anxiety. She has 2 tab left since prescription given on 10/24/22. Her counselor helps with management/pill count. Recent stressor includes that her Estranged husband/ex- committed suicide. She signed up for medicaid expansion but has not yet found psychiatrist to help management of anxiety.  She also has fibromyalgia. She has hand and feet edema associated with lyrica, would like to go back to gabapentin  Insomnia- occasionally takes remeron 7.5mg  for sleep and would like refill Observations/Objective: Fluent speech, not pressured    Current Outpatient Medications:    albuterol (VENTOLIN HFA) 108 (90 Base) MCG/ACT inhaler, Inhale 2 puffs into the lungs every 6 (six) hours as needed for wheezing or shortness of breath., Disp: 1 each, Rfl: 2   bictegravir-emtricitabine-tenofovir AF (BIKTARVY) 50-200-25 MG TABS tablet, TAKE 1 TABLET BY MOUTH DAILY, Disp: 30 tablet, Rfl: 3   buprenorphine (SUBUTEX) 2 MG SUBL SL tablet, Place 10 mg under the tongue daily., Disp: , Rfl:    cyclobenzaprine (FLEXERIL) 10 MG tablet, Take 1 tablet (10 mg total) by mouth 2 (two) times daily as needed for muscle spasms., Disp: 20 tablet, Rfl: 0   Ensure (ENSURE), Take 237 mLs by mouth 2 (two) times  daily between meals. Dispense quantity for 3 month, Disp: 237 mL, Rfl: 11   gabapentin (NEURONTIN) 300 MG capsule, Take 1 capsule (300 mg total) by mouth 3 (three) times daily. Start with taking 1 tab daily x 5 days, then 1 tabs twice a day x 5 days, then 1 tab three times day., Disp: 90 capsule, Rfl: 3   mirtazapine (REMERON) 7.5 MG tablet, Take 1 tablet (7.5 mg total) by mouth at bedtime., Disp: 15 tablet, Rfl: 1   sertraline (ZOLOFT) 100 MG tablet, Take 1 tablet (100 mg total) by mouth daily., Disp: 30 tablet, Rfl: 11   clonazePAM (KLONOPIN) 1 MG tablet, Take 1 tablet (1 mg total) by mouth 2 (two) times daily as needed for anxiety., Disp: 30 tablet, Rfl: 0   Assessment and Plan: Hiv disease= well controlled. Continue on biktarvy  Depression/anxeity = will refill zoloft 100mg  daily. Appears to not abuse klonopin. Will refill addn #30 tabs with no refills. Anticipated to last 2 months. In the meantime, needs to find psychiatrist  Fibromyalgia= return back onto gabapentin  Insomnia = can continue to use remeron if needed  Will see back in ID clinic in 4 wks for reassessment  Follow Up Instructions:    I discussed the assessment and treatment plan with the patient. The patient was provided an opportunity to ask questions and all were answered. The patient agreed with the plan and demonstrated an understanding of the instructions.   The patient was advised to call back or seek an in-person evaluation if the symptoms worsen or if the condition fails to improve as anticipated.  I  provided 15 minutes of non-face-to-face time during this encounter.   Carlyle Basques, MD

## 2023-02-02 ENCOUNTER — Other Ambulatory Visit: Payer: Self-pay

## 2023-02-02 ENCOUNTER — Emergency Department (HOSPITAL_COMMUNITY): Payer: Medicaid Other

## 2023-02-02 ENCOUNTER — Encounter (HOSPITAL_COMMUNITY): Payer: Self-pay | Admitting: *Deleted

## 2023-02-02 ENCOUNTER — Emergency Department (HOSPITAL_COMMUNITY)
Admission: EM | Admit: 2023-02-02 | Discharge: 2023-02-02 | Disposition: A | Discharge status: Home / Self Care | Payer: Medicaid Other | Attending: Emergency Medicine | Admitting: Emergency Medicine

## 2023-02-02 DIAGNOSIS — Z21 Asymptomatic human immunodeficiency virus [HIV] infection status: Secondary | ICD-10-CM | POA: Diagnosis not present

## 2023-02-02 DIAGNOSIS — Z20822 Contact with and (suspected) exposure to covid-19: Secondary | ICD-10-CM | POA: Insufficient documentation

## 2023-02-02 DIAGNOSIS — J029 Acute pharyngitis, unspecified: Secondary | ICD-10-CM | POA: Diagnosis not present

## 2023-02-02 DIAGNOSIS — D703 Neutropenia due to infection: Secondary | ICD-10-CM

## 2023-02-02 DIAGNOSIS — R059 Cough, unspecified: Secondary | ICD-10-CM | POA: Diagnosis present

## 2023-02-02 DIAGNOSIS — F17200 Nicotine dependence, unspecified, uncomplicated: Secondary | ICD-10-CM | POA: Diagnosis not present

## 2023-02-02 DIAGNOSIS — R0789 Other chest pain: Secondary | ICD-10-CM | POA: Diagnosis not present

## 2023-02-02 DIAGNOSIS — J101 Influenza due to other identified influenza virus with other respiratory manifestations: Secondary | ICD-10-CM | POA: Diagnosis not present

## 2023-02-02 LAB — CBC WITH DIFFERENTIAL/PLATELET
Abs Immature Granulocytes: 0 10*3/uL (ref 0.00–0.07)
Basophils Absolute: 0 10*3/uL (ref 0.0–0.1)
Basophils Relative: 1 %
Eosinophils Absolute: 0 10*3/uL (ref 0.0–0.5)
Eosinophils Relative: 0 %
HCT: 38.8 % (ref 36.0–46.0)
Hemoglobin: 12.9 g/dL (ref 12.0–15.0)
Immature Granulocytes: 0 %
Lymphocytes Relative: 33 %
Lymphs Abs: 0.8 10*3/uL (ref 0.7–4.0)
MCH: 31.2 pg (ref 26.0–34.0)
MCHC: 33.2 g/dL (ref 30.0–36.0)
MCV: 93.7 fL (ref 80.0–100.0)
Monocytes Absolute: 0.3 10*3/uL (ref 0.1–1.0)
Monocytes Relative: 12 %
Neutro Abs: 1.3 10*3/uL — ABNORMAL LOW (ref 1.7–7.7)
Neutrophils Relative %: 54 %
Platelets: 145 10*3/uL — ABNORMAL LOW (ref 150–400)
RBC: 4.14 MIL/uL (ref 3.87–5.11)
RDW: 11.9 % (ref 11.5–15.5)
WBC: 2.4 10*3/uL — ABNORMAL LOW (ref 4.0–10.5)
nRBC: 0 % (ref 0.0–0.2)

## 2023-02-02 LAB — RESP PANEL BY RT-PCR (RSV, FLU A&B, COVID)  RVPGX2
Influenza A by PCR: NEGATIVE
Influenza B by PCR: POSITIVE — AB
Resp Syncytial Virus by PCR: NEGATIVE
SARS Coronavirus 2 by RT PCR: NEGATIVE

## 2023-02-02 LAB — TROPONIN I (HIGH SENSITIVITY): Troponin I (High Sensitivity): <2 ng/L (ref <18)

## 2023-02-02 LAB — BASIC METABOLIC PANEL
Anion gap: 9 (ref 5–15)
BUN: 11 mg/dL (ref 6–20)
CO2: 26 mmol/L (ref 22–32)
Calcium: 8.8 mg/dL — ABNORMAL LOW (ref 8.9–10.3)
Chloride: 102 mmol/L (ref 98–111)
Creatinine, Ser: 0.71 mg/dL (ref 0.44–1.00)
GFR, Estimated: >60 mL/min (ref >60)
Glucose, Bld: 94 mg/dL (ref 70–99)
Potassium: 3.8 mmol/L (ref 3.5–5.1)
Sodium: 137 mmol/L (ref 135–145)

## 2023-02-02 MED ORDER — SODIUM CHLORIDE 0.9 % IV BOLUS: 1000.0000 mL | Freq: Once | INTRAVENOUS | Status: DC

## 2023-02-02 MED ORDER — NICOTINE 21 MG/24HR TD PT24
21.0000 mg | MEDICATED_PATCH | Freq: Once | TRANSDERMAL | Status: DC
  Administered 2023-02-02: 21 mg via TRANSDERMAL
  Filled 2023-02-02: qty 1

## 2023-02-02 MED ORDER — CLONAZEPAM 0.5 MG PO TABS
1.0000 mg | ORAL_TABLET | Freq: Once | ORAL | Status: AC
  Administered 2023-02-02: 1 mg via ORAL
  Filled 2023-02-02: qty 2

## 2023-02-02 NOTE — ED Notes (Signed)
Pt demanding narcotic pain medication, this RN notified ED-PA, reports she wants to be discharged if can't get a percocet. ED-PA made aware

## 2023-02-02 NOTE — Discharge Instructions (Addendum)
Thank you for allowing me to be part of your care today.  You tested positive for influenza type B.  Due to already having 4 days of symptoms, unfortunately, you do not qualify for a Tamiflu prescription.  Your workup was overall reassuring, however he, your white blood cell count was a bit low.  I spoke with your infectious disease doctor who recommends supportive care at home.  It is important for you to stay well-hydrated during this time.  You may alternate Tylenol and ibuprofen every 4 hours as needed for body aches, headache, and/or fever.  I also recommend continued mask wearing while you are unwell and symptomatic.  I recommend follow-up with your infectious disease doctor to recheck your blood work.  Continue to take your medications as prescribed.  Return to the ER if you develop worsening of your symptoms or if you have any new concerns.

## 2023-02-02 NOTE — ED Provider Notes (Signed)
[facility] Provider Note   CSN: [id number] Arrival date & time: 02/02/23  1132     History  Chief Complaint  Patient presents with   Chest Pain    Samantha Fleming is a 42 y.o. female with history significant for HIV, hepatitis C, tobacco use, IV drug use, currently receiving Subutex treatment, presents to the ED complaining of flulike symptoms including cough, chest and back pain, feeling clammy, sore throat, headache, fever with highest temp of 101.83F a couple of days ago.  She states her symptoms began 4 days prior.  Patient has been using over-the-counter cough medicine and Tylenol without much relief of symptoms.  Patient is followed by infectious disease and does take her daily Biktarvy, last blood results she states HIV was nondetectable.  Denies nausea, vomiting, diarrhea, abdominal pain, chest tightness, shortness of breath, congestion, rhinorrhea.       Home Medications Prior to Admission medications   Medication Sig Start Date End Date Taking? Authorizing Provider  albuterol (VENTOLIN HFA) 108 (90 Base) MCG/ACT inhaler Inhale 2 puffs into the lungs every 6 (six) hours as needed for wheezing or shortness of breath. 01/03/22   Carlyle Basques, MD  bictegravir-emtricitabine-tenofovir AF (BIKTARVY) 50-200-25 MG TABS tablet TAKE 1 TABLET BY MOUTH DAILY 11/20/22   Carlyle Basques, MD  buprenorphine (SUBUTEX) 2 MG SUBL SL tablet Place 10 mg under the tongue daily.    [provider]  clonazePAM (KLONOPIN) 1 MG tablet Take 1 tablet (1 mg total) by mouth 2 (two) times daily as needed for anxiety. 12/28/22   Carlyle Basques, MD  cyclobenzaprine (FLEXERIL) 10 MG tablet Take 1 tablet (10 mg total) by mouth 2 (two) times daily as needed for muscle spasms. 12/07/22   Wilnette Kales, PA  Ensure (ENSURE) Take 237 mLs by mouth 2 (two) times daily between meals. Dispense quantity for 3 month 02/23/22   Carlyle Basques, MD  gabapentin  (NEURONTIN) 300 MG capsule Take 1 capsule (300 mg total) by mouth 3 (three) times daily. Start with taking 1 tab daily x 5 days, then 1 tabs twice a day x 5 days, then 1 tab three times day. 12/28/22   Carlyle Basques, MD  mirtazapine (REMERON) 7.5 MG tablet Take 1 tablet (7.5 mg total) by mouth at bedtime. 12/28/22   Carlyle Basques, MD  sertraline (ZOLOFT) 100 MG tablet Take 1 tablet (100 mg total) by mouth daily. 12/28/22   Carlyle Basques, MD      Allergies    Asa [aspirin] and Rilpivirine    Review of Systems   Review of Systems  Constitutional:  Positive for fever.  HENT:  Positive for sore throat. Negative for congestion and rhinorrhea.   Respiratory:  Positive for cough. Negative for chest tightness and shortness of breath.   Cardiovascular:  Positive for chest pain.  Gastrointestinal:  Negative for abdominal pain, diarrhea, nausea and vomiting.  Musculoskeletal:  Positive for myalgias.  Neurological:  Positive for headaches.    Physical Exam Updated Vital Signs BP (!) 121/97   Pulse 70   Temp 99.6 F (37.6 C) (Oral)   Resp 20   Ht 5' 6"$  (1.676 m)   Wt 77.1 kg   LMP 01/29/2023 (Approximate)   SpO2 100%   BMI 27.44 kg/m  Physical Exam Vitals and nursing note reviewed.  Constitutional:      General: She is not in acute distress.    Appearance: She is not ill-appearing.  HENT:  Nose: Nose normal.     Mouth/Throat:     Mouth: Mucous membranes are moist.     Pharynx: Oropharynx is clear. Uvula midline. Posterior oropharyngeal erythema present. No oropharyngeal exudate or uvula swelling.     Tonsils: No tonsillar exudate.  Cardiovascular:     Rate and Rhythm: Normal rate and regular rhythm.     Pulses: Normal pulses.     Heart sounds: Normal heart sounds.  Pulmonary:     Effort: Pulmonary effort is normal. No respiratory distress.     Breath sounds: Normal breath sounds and air entry. No wheezing or rhonchi.  Abdominal:     General: Abdomen is flat. Bowel sounds are  normal. There is no distension.     Palpations: Abdomen is soft.     Tenderness: There is no abdominal tenderness.  Musculoskeletal:     Cervical back: Full passive range of motion without pain.  Skin:    General: Skin is warm and dry.     Capillary Refill: Capillary refill takes less than 2 seconds.  Neurological:     Mental Status: She is alert. Mental status is at baseline.  Psychiatric:        Mood and Affect: Mood is anxious.        Behavior: Behavior normal.     ED Results / Procedures / Treatments   Labs (all labs ordered are listed, but only abnormal results are displayed) Labs Reviewed  RESP PANEL BY RT-PCR (RSV, FLU A&B, COVID)  RVPGX2 - Abnormal; Notable for the following components:      Result Value   Influenza B by PCR POSITIVE (*)    All other components within normal limits  BASIC METABOLIC PANEL - Abnormal; Notable for the following components:   Calcium 8.8 (*)    All other components within normal limits  CBC WITH DIFFERENTIAL/PLATELET - Abnormal; Notable for the following components:   WBC 2.4 (*)    Platelets 145 (*)    Neutro Abs 1.3 (*)    All other components within normal limits  TROPONIN I (HIGH SENSITIVITY)    EKG EKG Interpretation  Date/Time:  Friday February 02 2023 11:57:09 EST Ventricular Rate:  90 PR Interval:  124 QRS Duration: 80 QT Interval:  336 QTC Calculation: 411 R Axis:   90 Text Interpretation: Normal sinus rhythm Rightward axis Borderline ECG No previous ECGs available Confirmed by Noemi Chapel [phone]) on 02/02/2023 3:44:41 PM  Radiology DG Chest Portable 1 View  Result Date: 02/02/2023 CLINICAL DATA:  Chest pain with shortness of breath and headaches for 1 week. Fever. EXAM: PORTABLE CHEST 1 VIEW COMPARISON:  None Available. FINDINGS: 1206 hours. The heart size and mediastinal contours are normal. The lungs are clear. There is no pleural effusion or pneumothorax. No acute osseous findings are identified. IMPRESSION: No  evidence of active cardiopulmonary process. Electronically Signed   By: Richardean Sale M.D.   On: 02/02/2023 12:17    Procedures Procedures    Medications Ordered in ED Medications  nicotine (NICODERM CQ - dosed in mg/24 hours) patch 21 mg (21 mg Transdermal Patch Applied 02/02/23 1421)  sodium chloride 0.9 % bolus 1,000 mL (1,000 mLs Intravenous Not Given 02/02/23 1452)  clonazePAM (KLONOPIN) tablet 1 mg (1 mg Oral Given 02/02/23 1449)    ED Course/ Medical Decision Making/ A&P                             Medical  Decision Making Amount and/or Complexity of Data Reviewed Labs: ordered. Radiology: ordered.  Risk OTC drugs. Prescription drug management.   This patient presents to the ED with chief complaint(s) of flulike symptoms with pertinent past medical history of HIV currently on medication, hep C, IV drug use currently undergoing treatment with Subutex.  The complaint involves an extensive differential diagnosis and also carries with it a high risk of complications and morbidity.    The differential diagnosis includes influenza, COVID, RSV, adenovirus, pneumonia, acute bronchitis, ACS less likely given patient's other presenting symptoms of being flulike  The initial plan is to obtain respiratory swab and baseline blood work including troponin  Additional history obtained: Additional history obtained from  none Records reviewed  infectious disease  Initial Assessment:   On initial exam, patient does appear to be mildly anxious but is not in acute distress and does not appear to be toxic.  Her skin is warm and dry and nondiaphoretic.  Heart rate is normal around 70 with regular rhythm.  Lungs are clear to auscultation bilaterally.  Abdomen is soft and nontender to palpation.  Posterior oropharynx is erythematous without exudate or swelling.  No obvious congestion or rhinorrhea appreciated on exam.  Independent ECG/labs interpretation:  The following labs were independently  interpreted:  Positive for influenza type B, negative for COVID and RSV.  Metabolic panel with mild hypocalcemia, no other electrolyte disturbances, renal function appears to be preserved and within her normal range.  CBC with evidence of leukopenia specifically neutropenia, and thrombocytopenia.  No anemia.  Initial troponin negative.    Independent visualization and interpretation of imaging: I independently visualized the following imaging with scope of interpretation limited to determining acute life threatening conditions related to emergency care: Chest x-ray, which revealed no evidence of pneumonia, pleural effusion, or pneumothorax.  I agree with radiologist interpretation  Treatment and Reassessment: Due to patient having high anxiety that she is treated with Klonopin for but has not had this yet today, will give her Klonopin.  Given that patient has had poor fluid intake due to her sore throat, fluid bolus was ordered, however, patient refused.  Patient refused IV and requested Percocet for her headache.  She was offered Tylenol, but refused, and continued to ask for Percocet.  She also stated that she would refuse any additional blood work.  Patient was increasingly agitated and was refusing ED treatment.  Due to patient's history and current use of buprenorphine, did not want to give opiate pain medications.    Blood work mildly concerning due to neutropenia, likely secondary to influenza.  Contacted patient's infectious disease provider, Dr. Carlyle Basques who recommended supportive care measures and Tamiflu if patient was less than 3 days since symptom onset.  Unfortunately, patient was on day 4 of symptoms and does not qualify for Tamiflu prescription.  Recommended patient continue to use ibuprofen and Tylenol for headache, fever, and bodyaches at home.  Disposition:   The patient has been appropriately medically screened and/or stabilized in the ED. I have low suspicion for any other  emergent medical condition which would require further screening, evaluation or treatment in the ED or require inpatient management. At time of discharge the patient is hemodynamically stable and in no acute distress. I have discussed work-up results and diagnosis with patient and answered all questions. Patient is agreeable with discharge plan. We discussed strict return precautions for returning to the emergency department and they verbalized understanding.  I was informed by RN that patient left  prior to receiving discharge paperwork and instructions.         Final Clinical Impression(s) / ED Diagnoses Final diagnoses:  Influenza B  Neutropenia associated with infection [facility])    Rx / DC Orders ED Discharge Orders     None         Pat Kocher, [place] 02/02/23 1724    Fredia Sorrow, MD 02/04/23 1530

## 2023-02-02 NOTE — ED Triage Notes (Signed)
Pt concerned for having pneumonia, + coughing-coughed up blood this morning x 4 days.  Chest pain, + clammy, + fevers 101.3 the other day. Taking mucinex and OTC cough medicine. + HA + sore throat

## 2023-02-02 NOTE — ED Notes (Addendum)
Pt left prior to this RN being able to review discharge papers. ED-PA made aware

## 2023-02-02 NOTE — ED Notes (Signed)
X-ray at bedside

## 2023-02-05 ENCOUNTER — Encounter (HOSPITAL_BASED_OUTPATIENT_CLINIC_OR_DEPARTMENT_OTHER): Payer: Self-pay

## 2023-02-05 ENCOUNTER — Telehealth: Payer: Self-pay

## 2023-02-05 ENCOUNTER — Other Ambulatory Visit: Payer: Self-pay

## 2023-02-05 ENCOUNTER — Other Ambulatory Visit: Payer: Self-pay | Admitting: Internal Medicine

## 2023-02-05 ENCOUNTER — Emergency Department (HOSPITAL_BASED_OUTPATIENT_CLINIC_OR_DEPARTMENT_OTHER)
Admission: EM | Admit: 2023-02-05 | Discharge: 2023-02-05 | Disposition: A | Discharge status: Home / Self Care | Payer: Medicaid Other | Attending: Emergency Medicine | Admitting: Emergency Medicine

## 2023-02-05 DIAGNOSIS — F17210 Nicotine dependence, cigarettes, uncomplicated: Secondary | ICD-10-CM | POA: Diagnosis not present

## 2023-02-05 DIAGNOSIS — J029 Acute pharyngitis, unspecified: Secondary | ICD-10-CM | POA: Insufficient documentation

## 2023-02-05 DIAGNOSIS — Z21 Asymptomatic human immunodeficiency virus [HIV] infection status: Secondary | ICD-10-CM | POA: Insufficient documentation

## 2023-02-05 DIAGNOSIS — F419 Anxiety disorder, unspecified: Secondary | ICD-10-CM

## 2023-02-05 LAB — GROUP A STREP BY PCR: Group A Strep by PCR: NOT DETECTED

## 2023-02-05 MED ORDER — CLONAZEPAM 1 MG PO TABS: 1.0000 mg | ORAL_TABLET | Freq: Two times a day (BID) | ORAL | 0 refills | Status: DC | PRN

## 2023-02-05 MED ORDER — DEXAMETHASONE 4 MG PO TABS
10.0000 mg | ORAL_TABLET | Freq: Once | ORAL | Status: AC
  Administered 2023-02-05: 10 mg via ORAL
  Filled 2023-02-05: qty 3

## 2023-02-05 MED ORDER — BENZONATATE 100 MG PO CAPS: 100.0000 mg | ORAL_CAPSULE | Freq: Three times a day (TID) | ORAL | 0 refills | Status: DC | PRN

## 2023-02-05 MED ORDER — IBUPROFEN 600 MG PO TABS: 600.0000 mg | ORAL_TABLET | Freq: Four times a day (QID) | ORAL | 0 refills | Status: DC | PRN

## 2023-02-05 MED ORDER — IBUPROFEN 400 MG PO TABS
600.0000 mg | ORAL_TABLET | Freq: Once | ORAL | Status: AC
  Administered 2023-02-05: 600 mg via ORAL
  Filled 2023-02-05: qty 1

## 2023-02-05 NOTE — ED Notes (Signed)
Reviewed discharge instructions with pt. Aware of prescriptions to pick up and follow up. Pt states understanding

## 2023-02-05 NOTE — ED Triage Notes (Signed)
Pt reports sore throat x 8days

## 2023-02-05 NOTE — Discharge Instructions (Addendum)
We evaluated you for your sore throat.  Your strep test was negative.  Please take 600 mg of ibuprofen every 6 hours as needed for pain.  I have also prescribed you a cough medicine which she can take 3 times a day as needed for cough.  Please drink hot tea with lemon and honey to help with your symptoms as well.  If you develop any worsening symptoms, cannot eat or drink, or have difficulty breathing, please return to the emergency department for repeat evaluation.

## 2023-02-05 NOTE — Telephone Encounter (Signed)
Patient called office to schedule follow up appointment with provider and to ask for temporary refill for Klonopin. States that she now has insurance is and is working on seeing Dr. Lendon Colonel with [facility] to take over prescription.  Would like prescription sent to [facility] in [place].

## 2023-02-05 NOTE — ED Provider Notes (Signed)
[facility] Provider Note  CSN: [id number] Arrival date & time: 02/05/23 0757  Chief Complaint(s) Sore Throat  HPI Samantha Fleming is a 42 y.o. female with previous history of IV drug use, on Subutex, HIV on ART with undetectable viral load presenting to the emergency department with sore throat.  Patient reports that she recently had bodyaches, headaches, fevers and chills, sore throat.  Her symptoms have all resolved except for the sore throat and cough.  She was seen at an outside emergency department 3 days ago, diagnosed with influenza.  Due to the length of symptoms she was not started on Tamiflu.  She presents today because of her persistent symptoms.  She is concerned she has strep throat.  Reports some painful swallowing but able to tolerate liquids and solids.  No difficulty breathing.  Symptoms are similar to previous without progressive worsening.  No ongoing fevers, chills.   Past Medical History Past Medical History:  Diagnosis Date   Anxiety    Depression    Fibromyalgia 04/29/2015   GAD (generalized anxiety disorder) 04/29/2015   History of hepatitis C 04/29/2015   History of intravenous drug use in remission 04/29/2015   History of intravenous drug use in remission    HIV disease ([place]) 04/29/2015   Insomnia 09/15/2015   Irregular menses 04/29/2015   Left leg pain 09/15/2015   Neuromuscular disorder ([place])    Pain of left calf 10/06/2015   PTSD (post-traumatic stress disorder) 04/29/2015   Reduced libido 09/15/2015   Smoker 04/29/2015   Tremor 10/06/2015   Vaginitis and vulvovaginitis 04/29/2015   Yeast infection 09/15/2015   Patient Active Problem List   Diagnosis Date Noted   Screening for cervical cancer 05/20/2019   Contraceptive education 05/20/2019   Cyst of Bartholin's gland duct 05/20/2019   Vaginal discharge 05/20/2019   Tremor 10/06/2015   Insomnia 09/15/2015   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 Authorizing Provider  benzonatate (TESSALON) 100 MG capsule Take 1 capsule (100 mg total) by mouth 3 (three) times daily as needed for cough. 02/05/23  Yes Cristie Hem, MD  ibuprofen (ADVIL) 600 MG tablet Take 1 tablet (600 mg total) by mouth every 6 (six) hours as needed. 02/05/23  Yes Cristie Hem, MD  albuterol (VENTOLIN HFA) 108 (90 Base) MCG/ACT inhaler Inhale 2 puffs into the lungs every 6 (six) hours as needed for wheezing or shortness of breath. 01/03/22   Carlyle Basques, MD  bictegravir-emtricitabine-tenofovir AF (BIKTARVY) 50-200-25 MG TABS tablet TAKE 1 TABLET BY MOUTH DAILY 11/20/22   Carlyle Basques, MD  buprenorphine (SUBUTEX) 2 MG SUBL SL tablet Place 10 mg under the tongue daily.    [provider]  clonazePAM (KLONOPIN) 1 MG tablet Take 1 tablet (1 mg total) by mouth 2 (two) times daily as needed for anxiety. 12/28/22   Carlyle Basques, MD  cyclobenzaprine (FLEXERIL) 10 MG tablet Take 1 tablet (10 mg total) by mouth 2 (two) times daily as needed for muscle spasms. 12/07/22   Wilnette Kales, PA  Ensure (ENSURE) Take 237 mLs  by mouth 2 (two) times daily between meals. Dispense quantity for 3 month 02/23/22   Carlyle Basques, MD  gabapentin (NEURONTIN) 300 MG capsule Take 1 capsule (300 mg total) by mouth 3 (three) times daily. Start with taking 1 tab daily x 5 days, then 1 tabs twice a day x 5 days, then 1 tab three times day. 12/28/22   Carlyle Basques, MD  mirtazapine (REMERON) 7.5 MG tablet Take 1 tablet (7.5 mg total) by mouth at bedtime.  12/28/22   Carlyle Basques, MD  sertraline (ZOLOFT) 100 MG tablet Take 1 tablet (100 mg total) by mouth daily. 12/28/22   Carlyle Basques, MD                                                                                                                                    Past Surgical History Past Surgical History:  Procedure Laterality Date   DILATION AND EVACUATION N/A 09/16/2016   Procedure: DILATATION AND EVACUATION;  Surgeon: Chancy Milroy, MD;  Location: [facility];  Service: Gynecology;  Laterality: N/A;   Family History Family History  Problem Relation Age of Onset   Fibromyalgia Mother    Sudden death Maternal Grandfather     Social History Social History   Tobacco Use   Smoking status: Every Day    Packs/day: 1.00    Years: 16.00    Total pack years: 16.00    Types: Cigarettes   Smokeless tobacco: Never  Substance Use Topics   Alcohol use: Not Currently    Alcohol/week: 1.0 standard drink of alcohol    Types: 1 Glasses of wine per week    Comment: occasional   Drug use: Yes    Types: Marijuana    Comment: history of polysubstance drug use    Allergies Asa [aspirin] and Rilpivirine  Review of Systems Review of Systems  All other systems reviewed and are negative.   Physical Exam Vital Signs  I have reviewed the triage vital signs BP (!) 123/95   Pulse 80   Temp 98.7 F (37.1 C) (Oral)   Resp 18   Ht 5' 6"$  (1.676 m)   Wt 77 kg   LMP 01/29/2023 (Approximate)   SpO2 97%   BMI 27.40 kg/m  Physical Exam Vitals and nursing note reviewed.  Constitutional:      General: She is not in acute distress.    Appearance: She is well-developed.  HENT:     Head: Normocephalic and atraumatic.     Mouth/Throat:     Mouth: Mucous membranes are moist.     Comments: Posterior pharyngeal erythema, mild tonsillar exudate, mild tonsillar swelling and erythema, uvula midline, floor of mouth soft, no external facial swelling, neck supple Eyes:     Pupils: Pupils are equal,  round, and reactive to light.  Cardiovascular:     Rate and Rhythm: Normal rate and regular rhythm.     Heart sounds:  No murmur heard. Pulmonary:     Effort: Pulmonary effort is normal. No respiratory distress.     Breath sounds: Normal breath sounds.  Abdominal:     General: Abdomen is flat.     Palpations: Abdomen is soft.     Tenderness: There is no abdominal tenderness.  Musculoskeletal:        General: No tenderness.     Right lower leg: No edema.     Left lower leg: No edema.  Skin:    General: Skin is warm and dry.  Neurological:     General: No focal deficit present.     Mental Status: She is alert. Mental status is at baseline.  Psychiatric:        Mood and Affect: Mood normal.        Behavior: Behavior normal.     ED Results and Treatments Labs (all labs ordered are listed, but only abnormal results are displayed) Labs Reviewed  GROUP A STREP BY PCR                                                                                                                          Radiology No results found.  Pertinent labs & imaging results that were available during my care of the patient were reviewed by me and considered in my medical decision making (see MDM for details).  Medications Ordered in ED Medications  dexamethasone (DECADRON) tablet 10 mg (10 mg Oral Given 02/05/23 0853)  ibuprofen (ADVIL) tablet 600 mg (600 mg Oral Given 02/05/23 [id number])                                                                                                                                     Procedures Procedures  (including critical care time)  Medical Decision Making / ED Course   MDM:  42 year old female presenting to the emergency department with sore throat.  Patient well-appearing, physical exam with erythematous posterior pharynx and tonsillar swelling with mild exudate.  Suspect symptoms are most likely ongoing from influenza, no evidence of peritonsillar abscess,  retropharyngeal abscess, Ludwig's angina, dental infection.  Could also be strep throat but less likely given age, will check strep PCR.  If strep positive will treat with antibiotics.  Will give dose of dexamethasone in the emergency department as her symptom control.  will also treat with Tessalon Perles. Clinical Course as of  02/05/23 0936  Mon Feb 05, 2023  0935 Strep negative. Will discharge patient to home. All questions answered. Patient comfortable with plan of discharge. Return precautions discussed with patient and specified on the after visit summary.  [WS]    Clinical Course User Index [WS] Cristie Hem, MD     Additional history obtained: -Additional history obtained from friend -External records from outside source obtained and reviewed including: Chart review including previous notes, labs, imaging, consultation notes including ED note 2/9   Lab Tests: -I ordered, reviewed, and interpreted labs.   The pertinent results include:   Labs Reviewed  GROUP A STREP BY PCR    Notable for strep negative.    Medicines ordered and prescription drug management: Meds ordered this encounter  Medications   dexamethasone (DECADRON) tablet 10 mg   ibuprofen (ADVIL) tablet 600 mg   ibuprofen (ADVIL) 600 MG tablet    Sig: Take 1 tablet (600 mg total) by mouth every 6 (six) hours as needed.    Dispense:  30 tablet    Refill:  0   benzonatate (TESSALON) 100 MG capsule    Sig: Take 1 capsule (100 mg total) by mouth 3 (three) times daily as needed for cough.    Dispense:  21 capsule    Refill:  0    -I have reviewed the patients home medicines and have made adjustments as needed   Social Determinants of Health:  Diagnosis or treatment significantly limited by social determinants of health: current smoker   Reevaluation: After the interventions noted above, I reevaluated the patient and found that they have improved  Co morbidities that complicate the patient  evaluation  Past Medical History:  Diagnosis Date   Anxiety    Depression    Fibromyalgia 04/29/2015   GAD (generalized anxiety disorder) 04/29/2015   History of hepatitis C 04/29/2015   History of intravenous drug use in remission 04/29/2015   History of intravenous drug use in remission    HIV disease ([place]) 04/29/2015   Insomnia 09/15/2015   Irregular menses 04/29/2015   Left leg pain 09/15/2015   Neuromuscular disorder ([place])    Pain of left calf 10/06/2015   PTSD (post-traumatic stress disorder) 04/29/2015   Reduced libido 09/15/2015   Smoker 04/29/2015   Tremor 10/06/2015   Vaginitis and vulvovaginitis 04/29/2015   Yeast infection 09/15/2015      Dispostion: Disposition decision including need for hospitalization was considered, and patient discharged from emergency department.    Final Clinical Impression(s) / ED Diagnoses Final diagnoses:  Sore throat     This chart was dictated using voice recognition software.  Despite best efforts to proofread,  errors can occur which can change the documentation meaning.    Cristie Hem, MD 02/05/23 [phone]

## 2023-02-13 ENCOUNTER — Other Ambulatory Visit: Payer: Self-pay

## 2023-02-13 ENCOUNTER — Ambulatory Visit (INDEPENDENT_AMBULATORY_CARE_PROVIDER_SITE_OTHER): Payer: Medicaid Other | Admitting: Internal Medicine

## 2023-02-13 ENCOUNTER — Encounter: Payer: Self-pay | Admitting: Internal Medicine

## 2023-02-13 VITALS — BP 108/76 | HR 80 | Temp 98.6°F | Wt 180.0 lb

## 2023-02-13 DIAGNOSIS — R519 Headache, unspecified: Secondary | ICD-10-CM | POA: Diagnosis not present

## 2023-02-13 DIAGNOSIS — Z716 Tobacco abuse counseling: Secondary | ICD-10-CM

## 2023-02-13 DIAGNOSIS — B20 Human immunodeficiency virus [HIV] disease: Secondary | ICD-10-CM

## 2023-02-13 DIAGNOSIS — Z79899 Other long term (current) drug therapy: Secondary | ICD-10-CM | POA: Diagnosis not present

## 2023-02-13 MED ORDER — NICOTINE 7 MG/24HR TD PT24: 7.0000 mg | MEDICATED_PATCH | Freq: Every day | TRANSDERMAL | 0 refills | Status: DC

## 2023-02-13 MED ORDER — BENZONATATE 100 MG PO CAPS: 100.0000 mg | ORAL_CAPSULE | Freq: Three times a day (TID) | ORAL | 0 refills | Status: DC | PRN

## 2023-02-13 MED ORDER — MIRTAZAPINE 7.5 MG PO TABS: 7.5000 mg | ORAL_TABLET | Freq: Every day | ORAL | 11 refills | Status: DC

## 2023-02-13 NOTE — Progress Notes (Addendum)
Patient ID: Samantha Fleming, female   DOB: July 26, 1981, 42 y.o.   MRN: [id number]  Samantha Fleming is a 42yo F with well controlled hiv disease, anxiety/panic disorder. She reports that she is Recovering from flu 3 weeks ago. She states she has started nicotine patches in order to stop smoking. She is also reports having short lasting episodes of shooting pain to left temple.no palpable pain or rash to her forehead.  She brings paperwork in order to try to be part of the Dr [employer] practice.for follow up. Currently she reports that she continues on taking zoloft however still has episodic panic/anxiety type attacks. Has been taking klonopin as needed, but not overused. Has counselor that also checks in with her.   Outpatient Encounter Medications as of 02/13/2023  Medication Sig   albuterol (VENTOLIN HFA) 108 (90 Base) MCG/ACT inhaler Inhale 2 puffs into the lungs every 6 (six) hours as needed for wheezing or shortness of breath.   bictegravir-emtricitabine-tenofovir AF (BIKTARVY) 50-200-25 MG TABS tablet TAKE 1 TABLET BY MOUTH DAILY   buprenorphine (SUBUTEX) 2 MG SUBL SL tablet Place 10 mg under the tongue daily.   clonazePAM (KLONOPIN) 1 MG tablet Take 1 tablet (1 mg total) by mouth 2 (two) times daily as needed for anxiety.   Ensure (ENSURE) Take 237 mLs by mouth 2 (two) times daily between meals. Dispense quantity for 3 month   gabapentin (NEURONTIN) 300 MG capsule Take 1 capsule (300 mg total) by mouth 3 (three) times daily. Start with taking 1 tab daily x 5 days, then 1 tabs twice a day x 5 days, then 1 tab three times day.   ibuprofen (ADVIL) 600 MG tablet Take 1 tablet (600 mg total) by mouth every 6 (six) hours as needed.   mirtazapine (REMERON) 7.5 MG tablet Take 1 tablet (7.5 mg total) by mouth at bedtime.   sertraline (ZOLOFT) 100 MG tablet Take 1 tablet (100 mg total) by mouth daily.   benzonatate (TESSALON) 100 MG capsule Take 1 capsule (100 mg total) by mouth 3 (three) times  daily as needed for cough. (Patient not taking: Reported on 02/13/2023)   cyclobenzaprine (FLEXERIL) 10 MG tablet Take 1 tablet (10 mg total) by mouth 2 (two) times daily as needed for muscle spasms. (Patient not taking: Reported on 02/13/2023)   No facility-administered encounter medications on file as of 02/13/2023.     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   Hepatitis C antibody test positive 04/29/2015   HIV disease ([place]) 04/29/2015   PTSD (post-traumatic stress disorder) 04/29/2015   Menorrhagia with irregular cycle 04/29/2015   GAD (generalized anxiety disorder) 04/29/2015   History of intravenous drug use in remission 04/29/2015   Smoker 04/29/2015   Fibromyalgia 04/29/2015     Health Maintenance Due  Topic Date Due   COVID-19 Vaccine (1) Never done   DTaP/Tdap/Td (1 - Tdap) Never done   PAP SMEAR-Modifier  05/19/2022     Review of Systems 12 oint ros is negative except what is mentioned above Physical Exam   Wt 180 lb (81.6 kg)   LMP 01/29/2023 (Approximate)   BMI 29.05 kg/m   Physical Exam  Constitutional:  oriented to person, place, and time. appears well-developed and well-nourished.  No distress.  HENT: [place]/AT, PERRLA, no scleral icterus Mouth/Throat: Oropharynx is clear and moist. No oropharyngeal exudate.  Cardiovascular: Normal rate, regular rhythm and normal heart sounds. Exam reveals no gallop and no friction rub.  No murmur heard.  Pulmonary/Chest: Effort normal and breath sounds normal. No respiratory distress.  has no wheezes.  Neck = supple, no nuchal rigidity Abdominal: Soft. Bowel sounds are normal.  exhibits no distension. There is no tenderness.  Lymphadenopathy: no cervical  adenopathy. No axillary adenopathy Neurological: alert and oriented to person, place, and time.  Skin: Skin is warm and dry. No rash noted. No erythema.  Psychiatric: a normal mood and affect.  behavior is normal.   Lab Results  Component Value Date   CD4TCELL 39 10/17/2022   Lab Results  Component Value Date   CD4TABS 793 10/17/2022   CD4TABS 677 05/25/2022   CD4TABS 866 01/03/2022   Lab Results  Component Value Date   HIV1RNAQUANT Not Detected 10/17/2022   Lab Results  Component Value Date   HEPBSAB POS (A) 04/14/2015   Lab Results  Component Value Date   LABRPR NON-REACTIVE 10/17/2022    CBC Lab Results  Component Value Date   WBC 2.4 (L) 02/02/2023   RBC 4.14 02/02/2023   HGB 12.9 02/02/2023   HCT 38.8 02/02/2023   PLT 145 (L) 02/02/2023   MCV 93.7 02/02/2023   MCH 31.2 02/02/2023   MCHC 33.2 02/02/2023   RDW 11.9 02/02/2023   LYMPHSABS 0.8 02/02/2023   MONOABS 0.3 02/02/2023   EOSABS 0.0 02/02/2023    BMET Lab Results  Component Value Date   NA 137 02/02/2023   K 3.8 02/02/2023   CL 102 02/02/2023   CO2 26 02/02/2023   GLUCOSE 94 02/02/2023   BUN 11 02/02/2023   CREATININE 0.71 02/02/2023   CALCIUM 8.8 (L) 02/02/2023   GFRNONAA >60 02/02/2023   GFRAA 95 05/24/2021      Assessment and Plan  Hiv disease = will check lab for hiv vl and cd 4 count ; plan to continue on biktarvy  Long term medication management = will check cr to see that it is stable  Smoking cessation = continue with plan for smoking cessation. Will give refills for patch  Headache = appears somewhat consistent with ice pick headache. Can try melatonin as one option to help.   Anxiety/PTSD = continue on current regimen. Will refill klonopin until she can get accepted into new psychiatrist to help assess better regimen.  Dr Percell Miller love to fax that to him.

## 2023-02-14 LAB — T-HELPER CELL (CD4) - (RCID CLINIC ONLY)
CD4 % Helper T Cell: 38 % (ref 33–65)
CD4 T Cell Abs: 781 /uL (ref 400–1790)

## 2023-02-15 LAB — CBC WITH DIFFERENTIAL/PLATELET
Absolute Monocytes: 526 cells/uL (ref 200–950)
Basophils Absolute: 50 cells/uL (ref 0–200)
Basophils Relative: 0.7 %
Eosinophils Absolute: 72 cells/uL (ref 15–500)
Eosinophils Relative: 1 %
HCT: 36.4 % (ref 35.0–45.0)
Hemoglobin: 12.6 g/dL (ref 11.7–15.5)
Lymphs Abs: 2210 cells/uL (ref 850–3900)
MCH: 31 pg (ref 27.0–33.0)
MCHC: 34.6 g/dL (ref 32.0–36.0)
MCV: 89.7 fL (ref 80.0–100.0)
MPV: 11.3 fL (ref 7.5–12.5)
Monocytes Relative: 7.3 %
Neutro Abs: 4342 cells/uL (ref 1500–7800)
Neutrophils Relative %: 60.3 %
Platelets: 213 10*3/uL (ref 140–400)
RBC: 4.06 10*6/uL (ref 3.80–5.10)
RDW: 11.8 % (ref 11.0–15.0)
Total Lymphocyte: 30.7 %
WBC: 7.2 10*3/uL (ref 3.8–10.8)

## 2023-02-15 LAB — COMPLETE METABOLIC PANEL WITH GFR
AG Ratio: 1.7 (calc) (ref 1.0–2.5)
ALT: 5 U/L — ABNORMAL LOW (ref 6–29)
AST: 10 U/L (ref 10–30)
Albumin: 4.2 g/dL (ref 3.6–5.1)
Alkaline phosphatase (APISO): 47 U/L (ref 31–125)
BUN: 11 mg/dL (ref 7–25)
CO2: 28 mmol/L (ref 20–32)
Calcium: 9.3 mg/dL (ref 8.6–10.2)
Chloride: 104 mmol/L (ref 98–110)
Creat: 0.81 mg/dL (ref 0.50–0.99)
Globulin: 2.5 g/dL (calc) (ref 1.9–3.7)
Glucose, Bld: 96 mg/dL (ref 65–99)
Potassium: 4.2 mmol/L (ref 3.5–5.3)
Sodium: 140 mmol/L (ref 135–146)
Total Bilirubin: 0.2 mg/dL (ref 0.2–1.2)
Total Protein: 6.7 g/dL (ref 6.1–8.1)
eGFR: 94 mL/min/{1.73_m2} (ref >=60)

## 2023-02-15 LAB — HIV-1 RNA QUANT-NO REFLEX-BLD
HIV 1 RNA Quant: NOT DETECTED Copies/mL
HIV-1 RNA Quant, Log: NOT DETECTED Log cps/mL

## 2023-02-16 MED ORDER — ALBUTEROL SULFATE HFA 108 (90 BASE) MCG/ACT IN AERS: 2.0000 | INHALATION_SPRAY | Freq: Four times a day (QID) | RESPIRATORY_TRACT | 2 refills | Status: DC | PRN

## 2023-02-16 MED ORDER — NICOTINE 21 MG/24HR TD PT24: 21.0000 mg | MEDICATED_PATCH | Freq: Every day | TRANSDERMAL | 0 refills | Status: DC

## 2023-02-19 ENCOUNTER — Telehealth: Payer: Self-pay

## 2023-02-19 NOTE — Telephone Encounter (Signed)
Patient called requesting gabapentin prescription be transferred to [facility] on [address]. Called [facility] on [address], spoke with pharmacy technician Faith who was able to transfer the prescription from the [place] location.   Beryle Flock, RN

## 2023-03-10 DIAGNOSIS — Z5181 Encounter for therapeutic drug level monitoring: Secondary | ICD-10-CM | POA: Diagnosis not present

## 2023-03-12 ENCOUNTER — Telehealth: Payer: Self-pay

## 2023-03-12 NOTE — Telephone Encounter (Signed)
Patient called office to follow up on paper work she handed provider at last visit and requesting refill for Klonopin.  States that she is trying to get in to see Dr. Erling Cruz with [facility],but since they have not received completed forms they would not be able to schedule appointment.  Patient is out of her Klonopin and is requesting refills be sent to [facility] until she is seen by Dr. Erling Cruz.  Will reach out to provider for update. [facility] [phone] - [place], [address]    Will call patient with update. Leatrice Jewels, RMA

## 2023-03-13 ENCOUNTER — Other Ambulatory Visit: Payer: Self-pay | Admitting: Internal Medicine

## 2023-03-13 ENCOUNTER — Ambulatory Visit: Payer: Medicaid Other | Admitting: Family Medicine

## 2023-03-13 DIAGNOSIS — F419 Anxiety disorder, unspecified: Secondary | ICD-10-CM

## 2023-03-13 MED ORDER — CLONAZEPAM 1 MG PO TABS: 1.0000 mg | ORAL_TABLET | Freq: Two times a day (BID) | ORAL | 0 refills | Status: DC | PRN

## 2023-03-14 NOTE — Telephone Encounter (Signed)
Form completed by provider. Left voicemail requesting patient call office back.  Will need to ask patient if she would like notes from [place] provider faxed as well as notes from [place]. If so will need to mail form to Dr. Tressia Danas office.  Leatrice Jewels, RMA

## 2023-03-14 NOTE — Telephone Encounter (Signed)
Spoke with Dr. Baxter Flattery who confirmed she has forms patient dropped off for Dr. Erling Cruz. Form is 60 pages. Once completed she will hand forms to clinical team.  Leatrice Jewels, RMA

## 2023-03-19 ENCOUNTER — Other Ambulatory Visit: Payer: Self-pay | Admitting: Internal Medicine

## 2023-03-19 DIAGNOSIS — Z716 Tobacco abuse counseling: Secondary | ICD-10-CM

## 2023-03-20 ENCOUNTER — Other Ambulatory Visit: Payer: Self-pay | Admitting: Internal Medicine

## 2023-03-20 DIAGNOSIS — M797 Fibromyalgia: Secondary | ICD-10-CM

## 2023-04-12 ENCOUNTER — Ambulatory Visit (INDEPENDENT_AMBULATORY_CARE_PROVIDER_SITE_OTHER): Payer: Medicaid Other | Admitting: Family Medicine

## 2023-04-12 ENCOUNTER — Encounter: Payer: Self-pay | Admitting: Family Medicine

## 2023-04-12 VITALS — BP 110/70 | HR 90 | Temp 98.3°F | Ht 65.0 in | Wt 179.8 lb

## 2023-04-12 DIAGNOSIS — R29818 Other symptoms and signs involving the nervous system: Secondary | ICD-10-CM

## 2023-04-12 DIAGNOSIS — M25461 Effusion, right knee: Secondary | ICD-10-CM | POA: Diagnosis not present

## 2023-04-12 DIAGNOSIS — R519 Headache, unspecified: Secondary | ICD-10-CM

## 2023-04-12 DIAGNOSIS — Z87828 Personal history of other (healed) physical injury and trauma: Secondary | ICD-10-CM

## 2023-04-12 DIAGNOSIS — M797 Fibromyalgia: Secondary | ICD-10-CM

## 2023-04-12 MED ORDER — AMITRIPTYLINE HCL 25 MG PO TABS
25.0000 mg | ORAL_TABLET | Freq: Every day | ORAL | 2 refills | Status: DC
Start: 2023-04-12 — End: 2024-07-09

## 2023-04-12 NOTE — Progress Notes (Signed)
New Patient Office Visit  Subjective    Patient ID: Samantha Fleming, female    DOB: 1981-02-10  Age: 42 y.o. MRN: [id number]  CC:  Chief Complaint  Patient presents with   Establish Care    HPI Samantha Fleming presents to establish care Patient is reporting right knee pain. 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 She reports that it is "crunching" it is swollen and very tender to walk on. Was seen in the ER for this in December 2023, it was documented that she had a very large knee effusion. She reports that the   Pt reports that she is having severe headaches, states that they have been going on for years, states that she feels a stabbing pain in the left temple. Very random, goes, states that when she gets the pain she has alterations in her vision. States that she has about 3 episodes per week, states it only lasts about 10-15 seconds.   Pt reports she is trying to get in to see a psychiatrist, states that she take the clonazepam for sleep mostly but sometimes during the day for her anxiety. I extensively reviewed her medications, past medical history, social history and surgical history. She has a history of HIV positive status and is on treatment for this, will continue to see the ID physician for this treatment.   Outpatient Encounter Medications as of 04/12/2023  Medication Sig   albuterol (VENTOLIN HFA) 108 (90 Base) MCG/ACT inhaler Inhale 2 puffs into the lungs every 6 (six) hours as needed for wheezing or shortness of breath.   amitriptyline (ELAVIL) 25 MG tablet Take 1 tablet (25 mg total) by mouth at bedtime.   benzonatate (TESSALON) 100 MG capsule Take 1 capsule (100 mg total) by mouth 3 (three) times daily as needed for cough.   bictegravir-emtricitabine-tenofovir AF (BIKTARVY) 50-200-25 MG TABS tablet TAKE 1 TABLET BY MOUTH DAILY   buprenorphine (SUBUTEX) 2 MG SUBL  SL tablet Place 10 mg under the tongue daily.   clonazePAM (KLONOPIN) 1 MG tablet Take 1 tablet (1 mg total) by mouth 2 (two) times daily as needed for anxiety.   Ensure (ENSURE) Take 237 mLs by mouth 2 (two) times daily between meals. Dispense quantity for 3 month   gabapentin (NEURONTIN) 300 MG capsule TAKE 1 CAPSULE BY MOUTH DAILY FOR 5 DAYS, THEN 1 CAPSULE TWICE DAILY FOR 5 DAYS, THEN 1 CAPSULE THREE TIMES DAILY   ibuprofen (ADVIL) 600 MG tablet Take 1 tablet (600 mg total) by mouth every 6 (six) hours as needed.   nicotine (NICODERM CQ - DOSED IN MG/24 HOURS) 21 mg/24hr patch APPLY 1 PATCH(21 MG) TOPICALLY TO THE SKIN DAILY   sertraline (ZOLOFT) 100 MG tablet Take 1 tablet (100 mg total) by mouth daily.   [DISCONTINUED] mirtazapine (REMERON) 7.5 MG tablet Take 1 tablet (7.5 mg total) by mouth at bedtime.   [DISCONTINUED] cyclobenzaprine (FLEXERIL) 10 MG tablet Take 1 tablet (10 mg total) by mouth 2 (two) times daily as needed for muscle spasms. (Patient not taking: Reported on 02/13/2023)   No facility-administered encounter medications on file as of 04/12/2023.    Past Medical History:  Diagnosis Date   Anxiety    Depression    Fibromyalgia 04/29/2015   GAD (generalized anxiety disorder) 04/29/2015   History of hepatitis C 04/29/2015   History of intravenous drug  use in remission 04/29/2015   History of intravenous drug use in remission    HIV disease 04/29/2015   Insomnia 09/15/2015   Irregular menses 04/29/2015   Left leg pain 09/15/2015   Neuromuscular disorder    Pain of left calf 10/06/2015   PTSD (post-traumatic stress disorder) 04/29/2015   Reduced libido 09/15/2015   Smoker 04/29/2015   Tremor 10/06/2015   Vaginitis and vulvovaginitis 04/29/2015   Yeast infection 09/15/2015    Past Surgical History:  Procedure Laterality Date   DILATION AND EVACUATION N/A 09/16/2016   Procedure: DILATATION AND EVACUATION;  Surgeon: Hermina Staggers, MD;  Location: [facility];  Service: Gynecology;  Laterality:  N/A;    Family History  Problem Relation Age of Onset   Depression Mother    Arthritis Mother    Alcohol abuse Mother    Fibromyalgia Mother    Mental illness Mother    Mental illness Father    Drug abuse Father    Depression Father    Alcohol abuse Father    Hyperlipidemia Maternal Grandmother    Depression Maternal Grandmother    Arthritis Maternal Grandmother    Mental illness Maternal Grandmother    Heart disease Maternal Grandfather    Hearing loss Maternal Grandfather    Early death Maternal Grandfather    COPD Maternal Grandfather    Arthritis Maternal Grandfather    Alcohol abuse Maternal Grandfather    Sudden death Maternal Grandfather     Social History   Socioeconomic History   Marital status: Widowed    Spouse name: Not on file   Number of children: Not on file   Years of education: Not on file   Highest education level: Not on file  Occupational History   Not on file  Tobacco Use   Smoking status: Every Day    Packs/day: 1.00    Years: 16.00    Additional pack years: 0.00    Total pack years: 16.00    Types: Cigarettes   Smokeless tobacco: Never  Vaping Use   Vaping Use: Not on file  Substance and Sexual Activity   Alcohol use: Yes    Alcohol/week: 1.0 standard drink of alcohol    Types: 1 Glasses of wine per week    Comment: occasional   Drug use: Yes    Types: Marijuana    Comment: history of polysubstance drug use    Sexual activity: Yes    Partners: Male    Birth control/protection: None    Comment: declined condoms  Other Topics Concern   Not on file  Social History Narrative   Not on file   Social Determinants of Health   Financial Resource Strain: Not on file  Food Insecurity: Not on file  Transportation Needs: Not on file  Physical Activity: Not on file  Stress: Not on file  Social Connections: Not on file  Intimate Partner Violence: Not on file    Review of Systems  All other systems reviewed and are negative.        Objective    BP 110/70 (BP Location: Left Arm, Patient Position: Sitting, Cuff Size: Normal)   Pulse 90   Temp 98.3 F (36.8 C) (Oral)   Ht  (1.651 m)   Wt 179 lb 12.8 oz (81.6 kg)   LMP 04/08/2023 (Exact Date)   SpO2 100%   BMI 29.92 kg/m   Physical Exam Vitals reviewed.  Constitutional:      Appearance: Normal appearance. She is well-groomed  and normal weight.  Eyes:     Conjunctiva/sclera: Conjunctivae normal.  Neck:     Thyroid: No thyromegaly.  Cardiovascular:     Rate and Rhythm: Normal rate and regular rhythm.     Pulses: Normal pulses.     Heart sounds: S1 normal and S2 normal.  Pulmonary:     Effort: Pulmonary effort is normal.     Breath sounds: Normal breath sounds and air entry.  Abdominal:     General: Bowel sounds are normal.  Musculoskeletal:     Right lower leg: No edema.     Left lower leg: No edema.  Neurological:     Mental Status: She is alert and oriented to person, place, and time. Mental status is at baseline.     Gait: Gait is intact.  Psychiatric:        Mood and Affect: Mood and affect normal.        Speech: Speech normal.        Behavior: Behavior normal.        Judgment: Judgment normal.         Assessment & Plan:   Problem List Items Addressed This Visit       Unprioritized   Fibromyalgia    On gabapentin 300 mg TID, she reports it's not really working for her, states that the physician writing it will not increase the dose. We discussed adding amitriptyline at night 25 mg and she is agreeable to this plan.       Relevant Medications   amitriptyline (ELAVIL) 25 MG tablet   Other Relevant Orders   Vitamin B12 (Completed)   Vitamin D, 25-hydroxy (Completed)   Effusion of bursa of right knee - Primary    Will send to orthopedics for further evaluation, most likely there is internal derangement of the knee, likely a meniscal injury of some sort. Pt is already on NSAIDS, not a candidate for higher level pain  medication      Relevant Orders   Ambulatory referral to Orthopedic Surgery   Headache with neurologic deficit    Pt is having vision changes with the headaches. Will start amitriptyline 25 mg at bedtime and send patient for CT head to rule out other causes of her headaches. Pt states she has had a history of head trauma in the past as well...      Relevant Medications   amitriptyline (ELAVIL) 25 MG tablet   Other Relevant Orders   CT HEAD WO CONTRAST ( )   Other Visit Diagnoses     History of traumatic head injury       Relevant Orders   CT HEAD WO CONTRAST ( )       Return for annual physical with pap smear.   Karie Georges, MD

## 2023-04-12 NOTE — Patient Instructions (Signed)
STOP mirtazepine  Start amitriptyline

## 2023-04-13 LAB — VITAMIN D 25 HYDROXY (VIT D DEFICIENCY, FRACTURES): VITD: 27.2 ng/mL — ABNORMAL LOW (ref 30.00–100.00)

## 2023-04-13 LAB — VITAMIN B12: Vitamin B-12: 282 pg/mL (ref 211–911)

## 2023-04-16 ENCOUNTER — Telehealth: Payer: Self-pay

## 2023-04-16 ENCOUNTER — Other Ambulatory Visit: Payer: Self-pay | Admitting: Internal Medicine

## 2023-04-16 DIAGNOSIS — M25461 Effusion, right knee: Secondary | ICD-10-CM | POA: Insufficient documentation

## 2023-04-16 DIAGNOSIS — F419 Anxiety disorder, unspecified: Secondary | ICD-10-CM

## 2023-04-16 DIAGNOSIS — R29818 Other symptoms and signs involving the nervous system: Secondary | ICD-10-CM | POA: Insufficient documentation

## 2023-04-16 MED ORDER — CLONAZEPAM 1 MG PO TABS
1.0000 mg | ORAL_TABLET | Freq: Two times a day (BID) | ORAL | 0 refills | Status: DC | PRN
Start: 2023-04-16 — End: 2023-04-23

## 2023-04-16 NOTE — Assessment & Plan Note (Signed)
On gabapentin 300 mg TID, she reports it's not really working for her, states that the physician writing it will not increase the dose. We discussed adding amitriptyline at night 25 mg and she is agreeable to this plan.

## 2023-04-16 NOTE — Telephone Encounter (Signed)
Received a call from patient asking for a refill on klonopin and to re-fax referral to behavioral health. Patient stated that she called to check on referral to referral and they told her that they have a new referral person and it was probably lost. Told patient I will refax paperwork and Dr. Drue Second stated that she will send in prescription for klonopin.

## 2023-04-16 NOTE — Assessment & Plan Note (Signed)
Pt is having vision changes with the headaches. Will start amitriptyline 25 mg at bedtime and send patient for CT head to rule out other causes of her headaches. Pt states she has had a history of head trauma in the past as well.Marland KitchenMarland Kitchen

## 2023-04-16 NOTE — Assessment & Plan Note (Signed)
Will send to orthopedics for further evaluation, most likely there is internal derangement of the knee, likely a meniscal injury of some sort. Pt is already on NSAIDS, not a candidate for higher level pain medication

## 2023-04-17 ENCOUNTER — Other Ambulatory Visit: Payer: Self-pay | Admitting: Internal Medicine

## 2023-04-17 DIAGNOSIS — B20 Human immunodeficiency virus [HIV] disease: Secondary | ICD-10-CM

## 2023-04-19 ENCOUNTER — Telehealth: Payer: Self-pay

## 2023-04-19 NOTE — Telephone Encounter (Addendum)
Patient called office stating she is not able to get her Klonopin filled at [facility]. Was told rx was on back order. Called [facility] in [place] and has rx in stock. Will need new prescription sent in. Will forward message to MD. Juanita Laster, RMA

## 2023-04-19 NOTE — Telephone Encounter (Signed)
Updated provider regarding patient call. Dr. Drue Second will send in new rx to pharmacy. Juanita Laster, RMA

## 2023-04-23 ENCOUNTER — Encounter: Payer: Self-pay | Admitting: Internal Medicine

## 2023-04-23 ENCOUNTER — Other Ambulatory Visit: Payer: Self-pay | Admitting: Internal Medicine

## 2023-04-23 ENCOUNTER — Other Ambulatory Visit (HOSPITAL_COMMUNITY): Payer: Self-pay

## 2023-04-23 DIAGNOSIS — F419 Anxiety disorder, unspecified: Secondary | ICD-10-CM

## 2023-04-23 MED ORDER — CLONAZEPAM 1 MG PO TABS
1.0000 mg | ORAL_TABLET | Freq: Two times a day (BID) | ORAL | 0 refills | Status: DC | PRN
Start: 2023-04-23 — End: 2023-06-01
  Filled 2023-04-23: qty 30, 15d supply, fill #0

## 2023-04-27 DIAGNOSIS — M25561 Pain in right knee: Secondary | ICD-10-CM | POA: Diagnosis not present

## 2023-05-12 DIAGNOSIS — M25561 Pain in right knee: Secondary | ICD-10-CM | POA: Diagnosis not present

## 2023-05-15 ENCOUNTER — Telehealth (INDEPENDENT_AMBULATORY_CARE_PROVIDER_SITE_OTHER): Payer: Medicaid Other | Admitting: Internal Medicine

## 2023-05-15 ENCOUNTER — Other Ambulatory Visit: Payer: Self-pay

## 2023-05-15 DIAGNOSIS — F419 Anxiety disorder, unspecified: Secondary | ICD-10-CM

## 2023-05-15 DIAGNOSIS — B20 Human immunodeficiency virus [HIV] disease: Secondary | ICD-10-CM | POA: Diagnosis not present

## 2023-05-15 DIAGNOSIS — F5101 Primary insomnia: Secondary | ICD-10-CM | POA: Diagnosis not present

## 2023-05-15 DIAGNOSIS — G59 Mononeuropathy in diseases classified elsewhere: Secondary | ICD-10-CM | POA: Diagnosis not present

## 2023-05-15 MED ORDER — BIKTARVY 50-200-25 MG PO TABS: 1.0000 | ORAL_TABLET | Freq: Every day | ORAL | 11 refills | Status: DC

## 2023-05-15 MED ORDER — VITAMIN D (ERGOCALCIFEROL) 50000 UNITS PO CAPS: 1.0000 | ORAL_CAPSULE | ORAL | 0 refills | Status: DC

## 2023-05-15 MED ORDER — GABAPENTIN 400 MG PO CAPS
400.0000 mg | ORAL_CAPSULE | Freq: Three times a day (TID) | ORAL | 3 refills | Status: DC
Start: 2023-05-15 — End: 2023-09-06

## 2023-05-15 NOTE — Progress Notes (Signed)
Virtual Visit via Video Note  I connected with Samantha Fleming on 05/15/23 at  2:00 PM EDT by a video enabled telemedicine application and verified that I am speaking with the correct person using two identifiers.  Location: Patient: at home Provider: in clinic   I discussed the limitations of evaluation and management by telemedicine and the availability of in person appointments. The patient expressed understanding and agreed to proceed.  History of Present Illness:  Amiltryptiline started at bedtime -which has helped with sleeping  Getting to see if she can see can get established with [facility]  Continues at subutex (3 per day) clinic  She states she has intermittent tooth ache but now okay; has still debilitating neuropathy, myalgias but feels gabapentin is helping  Counting pills -klonopin - has 9.5 pills left out of 30 pills dispensed from end of april   Observations/Objective: Physical Exam  Constitutional:  oriented to person, place, and time. appears well-developed and well-nourished. No distress.  HENT: Alpha/AT, PERRLA, no scleral icterus Mouth/Throat: Oropharynx is clear and moist. No oropharyngeal exudate.  Neurological: alert and oriented to person, place, and time.  Skin: Skin is warm and dry. No rash noted. No erythema.  Psychiatric: a normal mood and affect.  behavior is normal.    Assessment and Plan: Anxiety/PTSD = will refill klonopin at beginning of June 2024 (30 pills lasting 1 month). She will continue to get drug tested through sub[facility] clinic. Every 28 days.  HIV disease = will refill biktarvy. Has been well controlled. We will check labs at next visit  Peripheral neuropathy = will increase dose to 400mg  TID and helping with OA of knees (somewhat)  Women's health = has upcoming appt gyn/mammo. Via pcp  Insomnia = continue with low dose amiltryptiline  Follow Up Instructions: RTC in 3 months   I discussed the assessment and treatment plan with  the patient. The patient was provided an opportunity to ask questions and all were answered. The patient agreed with the plan and demonstrated an understanding of the instructions.   The patient was advised to call back or seek an in-person evaluation if the symptoms worsen or if the condition fails to improve as anticipated.  I have personally spent 30 minutes involved in face-to-face and non-face-to-face activities for this patient on the day of the visit. Professional time spent includes the following activities: Preparing to see the patient (review of tests), Obtaining and/or reviewing separately obtained history (admission/discharge record), Performing a medically appropriate examination and/or evaluation , Ordering medications/tests/procedures, referring and communicating with other health care professionals, Documenting clinical information in the EMR, Independently interpreting results (not separately reported), Communicating results to the patient/family/caregiver, Counseling and educating the patient/family/caregiver and Care coordination (not separately reported).     [name], MD

## 2023-05-16 ENCOUNTER — Other Ambulatory Visit: Payer: Self-pay | Admitting: Internal Medicine

## 2023-05-16 NOTE — Telephone Encounter (Signed)
Please advise on rx.

## 2023-05-23 DIAGNOSIS — M2241 Chondromalacia patellae, right knee: Secondary | ICD-10-CM | POA: Diagnosis not present

## 2023-06-01 ENCOUNTER — Other Ambulatory Visit: Payer: Self-pay | Admitting: Internal Medicine

## 2023-06-01 DIAGNOSIS — F419 Anxiety disorder, unspecified: Secondary | ICD-10-CM

## 2023-06-01 MED ORDER — CLONAZEPAM 1 MG PO TABS
1.0000 mg | ORAL_TABLET | Freq: Two times a day (BID) | ORAL | 0 refills | Status: DC | PRN
Start: 2023-06-01 — End: 2023-07-09

## 2023-06-01 NOTE — Progress Notes (Signed)
Sent in refill for klonopin. Last refilled on 4/30. #30 tabs NR. Patient waiting to be established with psychiatry.

## 2023-06-12 ENCOUNTER — Encounter: Payer: Self-pay | Admitting: Family Medicine

## 2023-06-12 ENCOUNTER — Ambulatory Visit (INDEPENDENT_AMBULATORY_CARE_PROVIDER_SITE_OTHER): Payer: Medicaid Other | Admitting: Family Medicine

## 2023-06-12 ENCOUNTER — Other Ambulatory Visit (HOSPITAL_COMMUNITY)
Admission: RE | Admit: 2023-06-12 | Discharge: 2023-06-12 | Disposition: A | Discharge status: Home / Self Care | Payer: Medicaid Other | Source: Ambulatory Visit | Attending: Family Medicine | Admitting: Family Medicine

## 2023-06-12 VITALS — BP 112/74 | HR 95 | Temp 98.2°F | Ht 64.96 in | Wt 184.6 lb

## 2023-06-12 DIAGNOSIS — Z124 Encounter for screening for malignant neoplasm of cervix: Secondary | ICD-10-CM

## 2023-06-12 DIAGNOSIS — R87611 Atypical squamous cells cannot exclude high grade squamous intraepithelial lesion on cytologic smear of cervix (ASC-H): Secondary | ICD-10-CM | POA: Diagnosis not present

## 2023-06-12 DIAGNOSIS — Z1231 Encounter for screening mammogram for malignant neoplasm of breast: Secondary | ICD-10-CM | POA: Diagnosis not present

## 2023-06-12 DIAGNOSIS — Z0000 Encounter for general adult medical examination without abnormal findings: Secondary | ICD-10-CM

## 2023-06-12 NOTE — Patient Instructions (Signed)

## 2023-06-12 NOTE — Progress Notes (Signed)
Complete physical exam  Patient: Samantha Fleming   DOB: March 04, 1981   41 y.o. Female  MRN: [id number]  Subjective:    Chief Complaint  Patient presents with   Annual Exam    Kimberlyann Eichenberger is a 42 y.o. female who presents today for a complete physical exam. She reports consuming a general diet. The patient has a physically strenuous job, but has no regular exercise apart from work.  She generally feels feeling depressed recently. She reports sleeping fairly well. She does not have additional problems to discuss today.    Most recent fall risk assessment:    06/12/2022   10:56 AM  Fall Risk   Falls in the past year? 0  Number falls in past yr: 0  Injury with Fall? 0     Most recent depression screenings:    04/12/2023    2:39 PM 10/24/2022    4:32 PM  PHQ 2/9 Scores  PHQ - 2 Score 1   Exception Documentation  Patient refusal    Vision:Not within last year  and Dental: No current dental problems and No regular dental care   Patient Active Problem List   Diagnosis Date Noted   Effusion of bursa of right knee 04/16/2023   Headache with neurologic deficit 04/16/2023   Screening for cervical cancer 05/20/2019   Contraceptive education 05/20/2019   Cyst of Bartholin's gland duct 05/20/2019   Vaginal discharge 05/20/2019   Tremor 10/06/2015   Insomnia 09/15/2015   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   Hepatitis C antibody test positive 04/29/2015   HIV disease (HCC) 04/29/2015   PTSD (post-traumatic stress disorder) 04/29/2015   Menorrhagia with irregular cycle 04/29/2015   GAD (generalized anxiety disorder) 04/29/2015   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   amitriptyline (ELAVIL) 25 MG tablet Take 1 tablet (25 mg total) by mouth at bedtime.   benzonatate (TESSALON) 100 MG capsule Take 1 capsule (100 mg total) by mouth 3 (three) times daily as needed for cough.   bictegravir-emtricitabine-tenofovir AF (BIKTARVY) 50-200-25 MG TABS tablet Take 1 tablet by mouth daily.   buprenorphine (SUBUTEX) 2 MG SUBL SL tablet Place 10 mg under the tongue daily.   clonazePAM (KLONOPIN) 1 MG tablet Take 1 tablet (1 mg) by mouth 2 times daily as needed for anxiety.   Ensure (ENSURE) Take 237 mLs by mouth 2 (two) times daily between meals. Dispense quantity for 3 month   gabapentin (NEURONTIN) 400 MG capsule Take 1 capsule (400 mg total) by mouth 3 (three) times daily.   ibuprofen (ADVIL) 600 MG tablet Take 1 tablet (600 mg total) by mouth every 6 (six) hours as needed.   nicotine (NICODERM CQ - DOSED IN MG/24 HOURS) 21 mg/24hr patch APPLY 1 PATCH(21 MG) TOPICALLY TO THE SKIN DAILY   sertraline (ZOLOFT) 100 MG tablet Take 1 tablet (100 mg total) by mouth daily.   Vitamin D, Ergocalciferol, 50000 units CAPS Take 1 capsule by mouth once a week.   No facility-administered medications prior to  visit.    Review of Systems  HENT:  Negative for hearing loss.   Eyes:  Negative for blurred vision.  Respiratory:  Negative for shortness of breath.   Cardiovascular:  Negative for chest pain.  Gastrointestinal: Negative.   Genitourinary: Negative.   Musculoskeletal:  Negative for back pain.  Neurological:  Negative for headaches.  Psychiatric/Behavioral:  Negative for depression.   All other systems reviewed and are negative.      Objective:     BP 112/74 (BP Location: Left Arm, Patient Position: Sitting, Cuff Size: Normal)   Pulse 95   Temp 98.2 F (36.8 C) (Oral)    Ht 5' 4.96" (1.65 m)   Wt 184 lb 9.6 oz (83.7 kg)   SpO2 97%   BMI 30.76 kg/m    Physical Exam Vitals reviewed. Exam conducted with a chaperone present.  Constitutional:      Appearance: She is normal weight.  Cardiovascular:     Rate and Rhythm: Normal rate and regular rhythm.     Pulses: Normal pulses.     Heart sounds: Normal heart sounds.  Pulmonary:     Effort: Pulmonary effort is normal.     Breath sounds: Normal breath sounds.  Chest:     Chest wall: No mass.  Breasts:    Tanner Score is 5.     Right: Normal. No mass or tenderness.     Left: Normal. No mass or tenderness.  Abdominal:     General: Abdomen is flat. Bowel sounds are normal.     Palpations: Abdomen is soft.  Genitourinary:    General: Normal vulva.     Exam position: Lithotomy position.     Tanner stage (genital): 5.     Vagina: Normal.     Cervix: Normal.     Uterus: Normal.      Adnexa: Right adnexa normal and left adnexa normal.     Rectum: Normal.  Lymphadenopathy:     Upper Body:     Right upper body: No axillary adenopathy.     Left upper body: No axillary adenopathy.  Neurological:     General: No focal deficit present.     Mental Status: She is alert and oriented to person, place, and time.  Psychiatric:        Mood and Affect: Mood and affect normal.      No results found for any visits on 06/12/23. Last metabolic panel Lab Results  Component Value Date   GLUCOSE 96 02/13/2023   NA 140 02/13/2023   K 4.2 02/13/2023   CL 104 02/13/2023   CO2 28 02/13/2023   BUN 11 02/13/2023   CREATININE 0.81 02/13/2023   EGFR 94 02/13/2023   CALCIUM 9.3 02/13/2023   PROT 6.7 02/13/2023   ALBUMIN 4.7 05/31/2017   BILITOT 0.2 02/13/2023   ALKPHOS 34 05/31/2017   AST 10 02/13/2023   ALT 5 (L) 02/13/2023   ANIONGAP 9 02/02/2023   Last lipids Lab Results  Component Value Date   CHOL 206 (H) 10/17/2022   HDL 50 10/17/2022   LDLCALC 125 (H) 10/17/2022   TRIG 190 (H) 10/17/2022   CHOLHDL  4.1 10/17/2022        Assessment & Plan:    Routine Health Maintenance and Physical Exam  Immunization History  Administered Date(s) Administered   Hepatitis A, Adult 04/29/2015   Hepatitis B, ADULT 04/29/2015   Influenza,inj,Quad PF,6+ Mos 09/15/2015, 12/12/2017   Pneumococcal-Unspecified 08/25/2014   Tdap 04/11/2021  Health Maintenance  Topic Date Due   PAP SMEAR-Modifier  06/26/2023   COVID-19 Vaccine (1) 04/11/2024 (Originally 09/14/1987)   INFLUENZA VACCINE  07/26/2023   DTaP/Tdap/Td (2 - Td or Tdap) 04/12/2031   Hepatitis C Screening  Completed   HIV Screening  Completed   HPV VACCINES  Aged Out    Discussed health benefits of physical activity, and encouraged her to engage in regular exercise appropriate for her age and condition.  Routine general medical examination at a health care facility Normal physical exam findings today, pap sent. Handouts given on healthy eating and exercise. Patient reports she has not been able to find a psych provider -- I advised that she try calling [facility] to see if they are accepting new patients.   Cervical cancer screening -     Cytology - PAP  Breast cancer screening by mammogram -     3D Screening Mammogram, Left and Right; Future    Return in about 6 months (around 12/12/2023).     Karie Georges, MD

## 2023-06-14 LAB — CYTOLOGY - PAP
Comment: NEGATIVE
Diagnosis: HIGH — AB
High risk HPV: NEGATIVE

## 2023-06-15 ENCOUNTER — Other Ambulatory Visit: Payer: Self-pay

## 2023-06-15 MED ORDER — ENSURE PO LIQD: 237.0000 mL | Freq: Two times a day (BID) | ORAL | 11 refills | Status: DC

## 2023-06-15 NOTE — Addendum Note (Signed)
Addended by: Karie Georges on: 06/15/2023 08:13 AM   Modules accepted: Orders

## 2023-06-27 ENCOUNTER — Ambulatory Visit
Admission: RE | Admit: 2023-06-27 | Discharge: 2023-06-27 | Disposition: A | Discharge status: Home / Self Care | Payer: Medicaid Other | Source: Ambulatory Visit | Attending: Family Medicine | Admitting: Family Medicine

## 2023-06-27 DIAGNOSIS — Z1231 Encounter for screening mammogram for malignant neoplasm of breast: Secondary | ICD-10-CM

## 2023-06-29 ENCOUNTER — Telehealth: Payer: Self-pay | Admitting: Family Medicine

## 2023-06-29 DIAGNOSIS — F411 Generalized anxiety disorder: Secondary | ICD-10-CM

## 2023-06-29 NOTE — Telephone Encounter (Signed)
Requesting psychiatry referral (medication and counseling) to [facility] (p) [phone]  ([phone]

## 2023-06-30 ENCOUNTER — Ambulatory Visit (HOSPITAL_BASED_OUTPATIENT_CLINIC_OR_DEPARTMENT_OTHER)
Admission: RE | Admit: 2023-06-30 | Discharge: 2023-06-30 | Disposition: A | Discharge status: Home / Self Care | Payer: Medicaid Other | Source: Ambulatory Visit | Attending: Family Medicine | Admitting: Family Medicine

## 2023-06-30 DIAGNOSIS — R519 Headache, unspecified: Secondary | ICD-10-CM | POA: Insufficient documentation

## 2023-06-30 DIAGNOSIS — R29818 Other symptoms and signs involving the nervous system: Secondary | ICD-10-CM | POA: Insufficient documentation

## 2023-06-30 DIAGNOSIS — Z87828 Personal history of other (healed) physical injury and trauma: Secondary | ICD-10-CM | POA: Insufficient documentation

## 2023-07-01 ENCOUNTER — Emergency Department (HOSPITAL_COMMUNITY): Payer: Medicaid Other

## 2023-07-01 ENCOUNTER — Encounter (HOSPITAL_COMMUNITY): Payer: Self-pay | Admitting: *Deleted

## 2023-07-01 ENCOUNTER — Emergency Department (HOSPITAL_COMMUNITY)
Admission: EM | Admit: 2023-07-01 | Discharge: 2023-07-01 | Disposition: A | Discharge status: Home / Self Care | Payer: Medicaid Other | Attending: Emergency Medicine | Admitting: Emergency Medicine

## 2023-07-01 ENCOUNTER — Other Ambulatory Visit: Payer: Self-pay

## 2023-07-01 DIAGNOSIS — M25511 Pain in right shoulder: Secondary | ICD-10-CM | POA: Diagnosis not present

## 2023-07-01 MED ORDER — MORPHINE SULFATE (PF) 4 MG/ML IV SOLN
4.0000 mg | Freq: Once | INTRAVENOUS | Status: AC
  Administered 2023-07-01: 4 mg via INTRAMUSCULAR
  Filled 2023-07-01: qty 1

## 2023-07-01 MED ORDER — OXYCODONE-ACETAMINOPHEN 5-325 MG PO TABS
1.0000 | ORAL_TABLET | Freq: Once | ORAL | Status: AC
  Administered 2023-07-01: 1 via ORAL
  Filled 2023-07-01: qty 1

## 2023-07-01 NOTE — ED Triage Notes (Signed)
Pt with right shoulder pain x 3 days. Denies injury.  Pt has seen chiropractor. Also c/o pelvic pain due to precancerous lesions.

## 2023-07-01 NOTE — ED Provider Notes (Signed)
[place] EMERGENCY DEPARTMENT AT [facility] Provider Note   CSN: [id number] Arrival date & time: 07/01/23  1256     History  Chief Complaint  Patient presents with   Shoulder Pain   Pelvic Pain    Samantha Fleming is a 42 y.o. female.   Shoulder Pain Associated symptoms: no back pain and no fever   Pelvic Pain Pertinent negatives include no shortness of breath.       Samantha Fleming is a 42 y.o. female with past medical history of fibromyalgia, anxiety, HIV who presents to the Emergency Department complaining of right shoulder pain x 3 days.  Denies known injury, but states that she does a lot of lifting pushing and pulling at her job.  Concerned that her shoulder is dislocated.  She describes pain to the anterior part of her shoulder that worsens with any attempted movement.  Pain improves when arm is flexed and held firmly to her chest.  She denies any numbness or tingling of her arm or fingers.  No neck pain, chest pain, shortness of breath, headaches or dizziness. She also complains of pelvic pain but states she is currently being seen by GYN for this and was diagnosed with precancerous lesions of her cervix.  She is waiting for follow-up appointment to have colposcopy.  States the main reason she is here in the emergency department today is for her shoulder pain.    Home Medications Prior to Admission medications   Medication Sig Start Date End Date Taking? Authorizing Provider  albuterol (VENTOLIN HFA) 108 (90 Base) MCG/ACT inhaler Inhale 2 puffs into the lungs every 6 (six) hours as needed for wheezing or shortness of breath. 02/16/23   [name], MD  amitriptyline (ELAVIL) 25 MG tablet Take 1 tablet (25 mg total) by mouth at bedtime. 04/12/23   Karie Georges, MD  benzonatate (TESSALON) 100 MG capsule Take 1 capsule (100 mg total) by mouth 3 (three) times daily as needed for cough. 02/13/23   [name], MD  bictegravir-emtricitabine-tenofovir AF  (BIKTARVY) 50-200-25 MG TABS tablet Take 1 tablet by mouth daily. 05/15/23   [name], MD  buprenorphine (SUBUTEX) 2 MG SUBL SL tablet Place 10 mg under the tongue daily.    [provider]  clonazePAM (KLONOPIN) 1 MG tablet Take 1 tablet (1 mg) by mouth 2 times daily as needed for anxiety. 06/01/23   [name], MD  Ensure (ENSURE) Take 237 mLs by mouth 2 (two) times daily between meals. Dispense quantity for 3 month 06/15/23   [name], MD  gabapentin (NEURONTIN) 400 MG capsule Take 1 capsule (400 mg total) by mouth 3 (three) times daily. 05/15/23   [name], MD  ibuprofen (ADVIL) 600 MG tablet Take 1 tablet (600 mg total) by mouth every 6 (six) hours as needed. 02/05/23   Lonell Grandchild, MD  nicotine (NICODERM CQ - DOSED IN MG/24 HOURS) 21 mg/24hr patch APPLY 1 PATCH(21 MG) TOPICALLY TO THE SKIN DAILY 03/19/23   [name], MD  sertraline (ZOLOFT) 100 MG tablet Take 1 tablet (100 mg total) by mouth daily. 12/28/22   [name], MD  Vitamin D, Ergocalciferol, 50000 units CAPS Take 1 capsule by mouth once a week. 05/15/23   [name], MD      Allergies    Asa [aspirin], Suboxone [buprenorphine-naloxone], and Rilpivirine    Review of Systems   Review of Systems  Constitutional:  Negative for appetite change, chills and fever.  Respiratory:  Negative  for cough and shortness of breath.   Gastrointestinal:  Negative for nausea and vomiting.  Musculoskeletal:  Positive for arthralgias (Right shoulder pain). Negative for back pain and neck stiffness.  Skin:  Negative for rash.  Neurological:  Negative for weakness and numbness.    Physical Exam Updated Vital Signs BP 128/81 (BP Location: Left Arm)   Pulse 72   Temp 98.9 F (37.2 C) (Oral)   Resp 17   Ht 5\' 5"  (1.651 m)   Wt 83 kg   LMP 07/01/2023   SpO2 98%   BMI 30.45 kg/m  Physical Exam Vitals and nursing note reviewed.  Constitutional:      General: She is not in acute distress.     Appearance: Normal appearance. She is not ill-appearing or toxic-appearing.  Cardiovascular:     Rate and Rhythm: Normal rate and regular rhythm.     Pulses: Normal pulses.  Pulmonary:     Effort: Pulmonary effort is normal.  Musculoskeletal:        General: Tenderness present. No swelling or deformity.     Right shoulder: Tenderness present. No deformity or crepitus. Decreased range of motion. Normal strength. Normal pulse.     Comments: Diffuse tenderness to palpation of anterior right shoulder.  Pain with attempted abduction.  I do not appreciate any bony deformity or step-off.  He has full range of motion of the right elbow and right wrist.  Grip strength is strong and symmetrical bilaterally.  Skin:    General: Skin is warm.     Capillary Refill: Capillary refill takes less than 2 seconds.  Neurological:     General: No focal deficit present.     Mental Status: She is alert.     Sensory: No sensory deficit.     Motor: No weakness.     ED Results / Procedures / Treatments   Labs (all labs ordered are listed, but only abnormal results are displayed) Labs Reviewed - No data to display  EKG None  Radiology DG Shoulder Right  Result Date: 07/01/2023 CLINICAL DATA:  Pain EXAM: RIGHT SHOULDER - 3 VIEW COMPARISON:  None Available. FINDINGS: There is no evidence of fracture or dislocation. There is no evidence of arthropathy or other focal bone abnormality. Soft tissues are unremarkable. IMPRESSION: Negative. Electronically Signed   By: Allegra Lai M.D.   On: 07/01/2023 16:05    Procedures Procedures    Medications Ordered in ED Medications  oxyCODONE-acetaminophen (PERCOCET/ROXICET) 5-325 MG per tablet 1 tablet (has no administration in time range)    ED Course/ Medical Decision Making/ A&P                             Medical Decision Making Patient here with 3-day history of right shoulder pain.  Denies known injury but states she does a lot of heavy lifting at her  job.  She has pain with range of motion of the shoulder and she is concerned that she has a dislocation.  No history of prior dislocation.  Denies neck pain and extremity numbness or weakness.  Differential would include but not limited to dislocation, tendinitis, ligamentous injury, musculoskeletal pain.  Fracture also considered but felt less likely given absence of trauma.  Cervical radiculopathy also considered but again felt less likely given absence of neck pain  Amount and/or Complexity of Data Reviewed Radiology: ordered.    Details: X-ray of the right shoulder without evidence of fracture or  dislocation. Discussion of management or test interpretation with external provider(s): Patient with possible rotator cuff versus labral injury.  Doubtful of tear.  Reassuring x-ray.  Neurovascularly intact.  Has orthopedic provider that she is seen in the past.  Agreeable to arrange close outpatient follow-up.  Currently takes Subutex chronically, understands that additional narcotic medications will not be prescribed.  Risk Prescription drug management.           Final Clinical Impression(s) / ED Diagnoses Final diagnoses:  Acute pain of right shoulder    Rx / DC Orders ED Discharge Orders     None         Rosey Bath 07/01/23 1736    Eber Hong, MD 07/01/23 2104

## 2023-07-01 NOTE — Discharge Instructions (Signed)
You may apply ice packs on and off to your shoulder.  Please call your orthopedic provider to arrange follow-up appointment.  Your x-ray today did not show evidence of a dislocation or any broken bones.

## 2023-07-02 ENCOUNTER — Other Ambulatory Visit: Payer: Self-pay | Admitting: Family Medicine

## 2023-07-02 DIAGNOSIS — R928 Other abnormal and inconclusive findings on diagnostic imaging of breast: Secondary | ICD-10-CM

## 2023-07-02 NOTE — Telephone Encounter (Signed)
Ok to place referral -- diagnosis is generalized anxiety disorder

## 2023-07-02 NOTE — Telephone Encounter (Signed)
Referral placed as below.  

## 2023-07-02 NOTE — Addendum Note (Signed)
Addended by: Johnella Moloney on: 07/02/2023 03:21 PM   Modules accepted: Orders

## 2023-07-02 NOTE — Telephone Encounter (Signed)
Left a detailed message with this information on Samantha Fleming's voicemail.

## 2023-07-06 ENCOUNTER — Ambulatory Visit
Admission: RE | Admit: 2023-07-06 | Discharge: 2023-07-06 | Disposition: A | Discharge status: Home / Self Care | Payer: Medicaid Other | Source: Ambulatory Visit | Attending: Family Medicine | Admitting: Family Medicine

## 2023-07-06 ENCOUNTER — Ambulatory Visit: Payer: Medicaid Other

## 2023-07-06 DIAGNOSIS — R928 Other abnormal and inconclusive findings on diagnostic imaging of breast: Secondary | ICD-10-CM | POA: Diagnosis not present

## 2023-07-09 ENCOUNTER — Telehealth: Payer: Self-pay

## 2023-07-09 ENCOUNTER — Other Ambulatory Visit: Payer: Self-pay | Admitting: Internal Medicine

## 2023-07-09 DIAGNOSIS — F419 Anxiety disorder, unspecified: Secondary | ICD-10-CM

## 2023-07-09 MED ORDER — CLONAZEPAM 1 MG PO TABS
1.0000 mg | ORAL_TABLET | Freq: Two times a day (BID) | ORAL | 0 refills | Status: AC | PRN
Start: 2023-07-09 — End: ?

## 2023-07-09 NOTE — Progress Notes (Signed)
Klonopin refill sent in for #30 tabs no refills to bridge to when she sees psychiatry.

## 2023-07-09 NOTE — Telephone Encounter (Signed)
Patient called requesting refill of klonopin be sent to [facility] on [address]. She has an appointment with [facility] scheduled for 7/26. Secure chat sent to Dr. Drue Second.   Sandie Ano, RN

## 2023-07-25 ENCOUNTER — Ambulatory Visit (INDEPENDENT_AMBULATORY_CARE_PROVIDER_SITE_OTHER): Payer: Medicaid Other | Admitting: Obstetrics & Gynecology

## 2023-07-25 ENCOUNTER — Other Ambulatory Visit (HOSPITAL_COMMUNITY)
Admission: RE | Admit: 2023-07-25 | Discharge: 2023-07-25 | Disposition: A | Discharge status: Home / Self Care | Payer: Medicaid Other | Source: Ambulatory Visit | Attending: Obstetrics & Gynecology | Admitting: Obstetrics & Gynecology

## 2023-07-25 ENCOUNTER — Encounter: Payer: Self-pay | Admitting: Obstetrics & Gynecology

## 2023-07-25 VITALS — BP 92/72 | HR 85 | Ht 65.0 in | Wt 181.0 lb

## 2023-07-25 DIAGNOSIS — R87610 Atypical squamous cells of undetermined significance on cytologic smear of cervix (ASC-US): Secondary | ICD-10-CM

## 2023-07-25 DIAGNOSIS — N870 Mild cervical dysplasia: Secondary | ICD-10-CM | POA: Diagnosis not present

## 2023-07-25 DIAGNOSIS — N921 Excessive and frequent menstruation with irregular cycle: Secondary | ICD-10-CM | POA: Diagnosis not present

## 2023-07-25 DIAGNOSIS — Z3202 Encounter for pregnancy test, result negative: Secondary | ICD-10-CM | POA: Diagnosis not present

## 2023-07-25 LAB — POCT URINE PREGNANCY: Preg Test, Ur: NEGATIVE

## 2023-07-25 NOTE — Progress Notes (Signed)
    Patient name: Kanna Karwoski MRN [id number]  Date of birth: 07-28-1981 Chief Complaint:   referral and Colposcopy  History of Present Illness:   Samantha Fleming is a 42 y.o. [phone] female being seen today for cervical dysplasia management.  Smoker:  Yes.   New sexual partner:  Yes.   For the past 4 yrs  Menses are regular- typically every 3 weeks- last 7-9 days.  She notes long-standing issue with heavy period.  Notes significant dysmenorrhea.  She notes that at some point she was supposed to undergo a laparoscopy for possible endometriosis, however then she found out she was pregnant.  She has not followed up regarding management of her menses or pain  Denies intermenstrual or postcoital bleeding  Recent pap with PCP: ASC-H, HPV negative  Patient's last menstrual period was 07/01/2023.     04/12/2023    2:39 PM 06/12/2022   10:56 AM 05/25/2022    4:28 PM 01/03/2022    4:01 PM 09/01/2021    3:43 PM  Depression screen PHQ 2/9  Decreased Interest 0 0 0 0 0  Down, Depressed, Hopeless 1 0 0 0 1  PHQ - 2 Score 1 0 0 0 1     Review of Systems:   Pertinent items are noted in HPI Denies fever/chills, dizziness, headaches, visual disturbances, fatigue, shortness of breath, chest pain, abdominal pain, vomiting. Pertinent History Reviewed:  Reviewed past medical,surgical, social, obstetrical and family history.  Reviewed problem list, medications and allergies. Physical Assessment:   Vitals:   07/25/23 1349  BP: 92/72  Pulse: 85  Weight: 181 lb (82.1 kg)  Height: 5\' 5"  (1.651 m)  Body mass index is 30.12 kg/m.       Physical Examination:   General appearance: alert, well appearing, and in no distress  Psych: mood appropriate, normal affect  Skin: warm & dry   Cardiovascular: normal heart rate noted  Respiratory: normal respiratory effort, no distress  Abdomen: soft, non-tender   Pelvic: VULVA: normal appearing vulva with no masses, tenderness or lesions, VAGINA: normal  appearing vagina with normal color and discharge, no lesions, CERVIX: normal appearing cervix without discharge or lesions, UTERUS: uterus is normal size, shape, consistency and nontender  Extremities: no edema   Chaperone: Faith Rogue     Colposcopy Procedure Note  Indications: ASC-H    Procedure Details  The risks and benefits of the procedure and Written informed consent obtained.  Speculum placed in vagina and excellent visualization of cervix achieved, cervix swabbed x 3 with acetic acid solution.  Findings: Adequate colposcopy is noted today.  TMZ zone present  Cervix: no visible lesions, nabothian cyst at 6:00 noted.   ECC and cervical biopsies obtained.    Monsel's applied.  Adequate hemostasis noted  Specimens: ECC and cervical biopsy obtained  Complications: none.  Colposcopic Impression: Negative   Plan(Based on 2019 ASCCP recommendations)  -Discussed HPV- reviewed incidence and its potential to cause condylomas to dysplasia to cervical cancer -Reviewed degree of abnormal pap smears  -Discussed ASCCP guidelines and current recommendations for colposcopy -As above, inform consent obtained and procedure completed -biopsies obtained, further management pending results -Questions and concerns were addressed  -In regards to issues related to her.  And dysmenorrhea, advised patient schedule a follow-up visit  Myna Hidalgo, DO Attending Obstetrician & Gynecologist, [facility] for [employer], [facility]

## 2023-08-06 ENCOUNTER — Other Ambulatory Visit: Payer: Self-pay | Admitting: Internal Medicine

## 2023-08-06 NOTE — Telephone Encounter (Signed)
Patient has PCP:    Karie Georges, MD    PCP - General, Family Medicine    [phone]   Sandie Ano, RN

## 2023-08-14 ENCOUNTER — Telehealth: Payer: Self-pay | Admitting: Family Medicine

## 2023-08-14 ENCOUNTER — Telehealth: Payer: Self-pay

## 2023-08-14 ENCOUNTER — Other Ambulatory Visit: Payer: Self-pay | Admitting: Internal Medicine

## 2023-08-14 DIAGNOSIS — F419 Anxiety disorder, unspecified: Secondary | ICD-10-CM
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
## 2023-08-14 NOTE — Telephone Encounter (Signed)
Pt requesting refill clonazePAM (KLONOPIN) 1 MG tablet, says her HIV doctor usually fills this.   [facility] [phone] - , [place] - [address] AT [facility] [address] Phone: [phone]  Fax: [phone]

## 2023-08-15 NOTE — Telephone Encounter (Signed)
Pt called, returning CMA's call. CMA was with a patient. Pt asked that CMA call back at her earliest convenience. 

## 2023-08-15 NOTE — Telephone Encounter (Signed)
Msg sent to Dr. Drue Second

## 2023-08-15 NOTE — Telephone Encounter (Signed)
Left a message for the patient to return my call.  

## 2023-08-15 NOTE — Telephone Encounter (Signed)
Spoke with the patient and informed her of the message bellow.  Patient stated has an appt with the psychiatrist on the 28th.

## 2023-08-15 NOTE — Telephone Encounter (Signed)
Dr. Drue Second was writing this for her until she could get in to see a psychiatrist. Please let patient know that she will need to contact Dr. Drue Second for refills-- also please ask if she was successful finding a psychiatrist. Thanks!

## 2023-08-23 DIAGNOSIS — F515 Nightmare disorder: Secondary | ICD-10-CM | POA: Diagnosis not present

## 2023-08-23 DIAGNOSIS — F411 Generalized anxiety disorder: Secondary | ICD-10-CM | POA: Diagnosis not present

## 2023-08-23 DIAGNOSIS — Z1331 Encounter for screening for depression: Secondary | ICD-10-CM | POA: Diagnosis not present

## 2023-08-23 DIAGNOSIS — Z6829 Body mass index (BMI) 29.0-29.9, adult: Secondary | ICD-10-CM | POA: Diagnosis not present

## 2023-08-23 DIAGNOSIS — F323 Major depressive disorder, single episode, severe with psychotic features: Secondary | ICD-10-CM | POA: Diagnosis not present

## 2023-09-06 ENCOUNTER — Other Ambulatory Visit: Payer: Self-pay | Admitting: Internal Medicine

## 2023-09-19 DIAGNOSIS — F323 Major depressive disorder, single episode, severe with psychotic features: Secondary | ICD-10-CM | POA: Diagnosis not present

## 2023-09-19 DIAGNOSIS — U071 COVID-19: Secondary | ICD-10-CM | POA: Diagnosis not present

## 2023-09-19 DIAGNOSIS — F515 Nightmare disorder: Secondary | ICD-10-CM | POA: Diagnosis not present

## 2023-09-19 DIAGNOSIS — Z760 Encounter for issue of repeat prescription: Secondary | ICD-10-CM | POA: Diagnosis not present

## 2023-09-19 DIAGNOSIS — F411 Generalized anxiety disorder: Secondary | ICD-10-CM | POA: Diagnosis not present

## 2023-10-17 DIAGNOSIS — F323 Major depressive disorder, single episode, severe with psychotic features: Secondary | ICD-10-CM | POA: Diagnosis not present

## 2023-10-17 DIAGNOSIS — Z6827 Body mass index (BMI) 27.0-27.9, adult: Secondary | ICD-10-CM | POA: Diagnosis not present

## 2023-10-17 DIAGNOSIS — F411 Generalized anxiety disorder: Secondary | ICD-10-CM | POA: Diagnosis not present

## 2023-10-17 DIAGNOSIS — F515 Nightmare disorder: Secondary | ICD-10-CM | POA: Diagnosis not present

## 2023-10-17 DIAGNOSIS — Z79899 Other long term (current) drug therapy: Secondary | ICD-10-CM | POA: Diagnosis not present

## 2023-10-17 DIAGNOSIS — Z3200 Encounter for pregnancy test, result unknown: Secondary | ICD-10-CM | POA: Diagnosis not present

## 2023-10-22 DIAGNOSIS — Z79899 Other long term (current) drug therapy: Secondary | ICD-10-CM | POA: Diagnosis not present

## 2023-11-16 DIAGNOSIS — F411 Generalized anxiety disorder: Secondary | ICD-10-CM | POA: Diagnosis not present

## 2023-11-16 DIAGNOSIS — F515 Nightmare disorder: Secondary | ICD-10-CM | POA: Diagnosis not present

## 2023-11-16 DIAGNOSIS — F323 Major depressive disorder, single episode, severe with psychotic features: Secondary | ICD-10-CM | POA: Diagnosis not present

## 2023-11-16 DIAGNOSIS — Z3200 Encounter for pregnancy test, result unknown: Secondary | ICD-10-CM | POA: Diagnosis not present

## 2023-11-16 DIAGNOSIS — Z6829 Body mass index (BMI) 29.0-29.9, adult: Secondary | ICD-10-CM | POA: Diagnosis not present

## 2023-11-16 DIAGNOSIS — Z79899 Other long term (current) drug therapy: Secondary | ICD-10-CM | POA: Diagnosis not present

## 2023-12-11 ENCOUNTER — Ambulatory Visit: Payer: MEDICAID | Admitting: Family Medicine

## 2023-12-13 DIAGNOSIS — M797 Fibromyalgia: Secondary | ICD-10-CM | POA: Diagnosis not present

## 2023-12-13 DIAGNOSIS — F515 Nightmare disorder: Secondary | ICD-10-CM | POA: Diagnosis not present

## 2023-12-13 DIAGNOSIS — F411 Generalized anxiety disorder: Secondary | ICD-10-CM | POA: Diagnosis not present

## 2023-12-13 DIAGNOSIS — Z3200 Encounter for pregnancy test, result unknown: Secondary | ICD-10-CM | POA: Diagnosis not present

## 2023-12-13 DIAGNOSIS — Z79899 Other long term (current) drug therapy: Secondary | ICD-10-CM | POA: Diagnosis not present

## 2023-12-13 DIAGNOSIS — F323 Major depressive disorder, single episode, severe with psychotic features: Secondary | ICD-10-CM | POA: Diagnosis not present

## 2023-12-13 DIAGNOSIS — Z6829 Body mass index (BMI) 29.0-29.9, adult: Secondary | ICD-10-CM | POA: Diagnosis not present

## 2023-12-18 DIAGNOSIS — Z79899 Other long term (current) drug therapy: Secondary | ICD-10-CM | POA: Diagnosis not present

## 2024-01-05 ENCOUNTER — Other Ambulatory Visit: Payer: Self-pay | Admitting: Internal Medicine

## 2024-01-07 NOTE — Telephone Encounter (Signed)
 Patient is now seeing behavioral health provider. Will have pharmacy request refills from his office.   [facility] NPI: [id number]  Sandie Ano, RN

## 2024-01-08 ENCOUNTER — Ambulatory Visit: Payer: Medicaid Other | Admitting: Family Medicine

## 2024-01-11 DIAGNOSIS — Z79899 Other long term (current) drug therapy: Secondary | ICD-10-CM | POA: Diagnosis not present

## 2024-01-12 ENCOUNTER — Other Ambulatory Visit: Payer: Self-pay | Admitting: Internal Medicine

## 2024-01-14 NOTE — Telephone Encounter (Signed)
Patient is seeing Psych provider:  [facility] NPI: [id number]  Sandie Ano, RN

## 2024-01-17 ENCOUNTER — Ambulatory Visit: Payer: Medicaid Other | Admitting: Internal Medicine

## 2024-01-18 ENCOUNTER — Other Ambulatory Visit: Payer: Self-pay | Admitting: Medical Genetics

## 2024-01-23 ENCOUNTER — Encounter: Payer: Self-pay | Admitting: Internal Medicine

## 2024-01-23 ENCOUNTER — Other Ambulatory Visit: Payer: Self-pay

## 2024-01-23 ENCOUNTER — Ambulatory Visit (INDEPENDENT_AMBULATORY_CARE_PROVIDER_SITE_OTHER): Payer: Medicaid Other | Admitting: Internal Medicine

## 2024-01-23 VITALS — BP 110/78 | HR 79 | Temp 98.1°F | Wt 186.4 lb

## 2024-01-23 DIAGNOSIS — F329 Major depressive disorder, single episode, unspecified: Secondary | ICD-10-CM | POA: Diagnosis not present

## 2024-01-23 DIAGNOSIS — R519 Headache, unspecified: Secondary | ICD-10-CM

## 2024-01-23 DIAGNOSIS — B20 Human immunodeficiency virus [HIV] disease: Secondary | ICD-10-CM | POA: Diagnosis not present

## 2024-01-23 DIAGNOSIS — M797 Fibromyalgia: Secondary | ICD-10-CM | POA: Diagnosis not present

## 2024-01-23 DIAGNOSIS — J4520 Mild intermittent asthma, uncomplicated: Secondary | ICD-10-CM

## 2024-01-23 DIAGNOSIS — F320 Major depressive disorder, single episode, mild: Secondary | ICD-10-CM

## 2024-01-23 MED ORDER — ALBUTEROL SULFATE HFA 108 (90 BASE) MCG/ACT IN AERS: 2.0000 | INHALATION_SPRAY | Freq: Four times a day (QID) | RESPIRATORY_TRACT | 2 refills | Status: DC | PRN

## 2024-01-23 MED ORDER — SERTRALINE HCL 50 MG PO TABS: 50.0000 mg | ORAL_TABLET | Freq: Every day | ORAL | 1 refills | Status: AC

## 2024-01-23 MED ORDER — GABAPENTIN 600 MG PO TABS
600.0000 mg | ORAL_TABLET | Freq: Three times a day (TID) | ORAL | 5 refills | Status: DC
Start: 2024-01-23 — End: 2024-06-24
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
## 2024-01-24 ENCOUNTER — Encounter: Payer: Self-pay | Admitting: Family Medicine

## 2024-01-24 ENCOUNTER — Ambulatory Visit: Payer: Medicaid Other | Admitting: Family Medicine

## 2024-01-24 VITALS — BP 118/78 | HR 93 | Temp 97.8°F | Ht 65.0 in | Wt 188.5 lb

## 2024-01-24 DIAGNOSIS — R251 Tremor, unspecified: Secondary | ICD-10-CM | POA: Diagnosis not present

## 2024-01-24 DIAGNOSIS — G43E09 Chronic migraine with aura, not intractable, without status migrainosus: Secondary | ICD-10-CM | POA: Insufficient documentation

## 2024-01-24 DIAGNOSIS — M797 Fibromyalgia: Secondary | ICD-10-CM

## 2024-01-24 LAB — T-HELPER CELL (CD4) - (RCID CLINIC ONLY)
CD4 % Helper T Cell: 41 % (ref 33–65)
CD4 T Cell Abs: 604 /uL (ref 400–1790)

## 2024-01-24 MED ORDER — KETOROLAC TROMETHAMINE 60 MG/2ML IM SOLN
60.0000 mg | Freq: Once | INTRAMUSCULAR | Status: AC
  Administered 2024-01-24: 60 mg via INTRAMUSCULAR

## 2024-01-24 MED ORDER — SUMATRIPTAN SUCCINATE 50 MG PO TABS: 50.0000 mg | ORAL_TABLET | Freq: Every day | ORAL | 5 refills | Status: AC | PRN

## 2024-01-24 MED ORDER — TOPIRAMATE 50 MG PO TABS: ORAL_TABLET | ORAL | 5 refills | Status: AC

## 2024-01-24 NOTE — Assessment & Plan Note (Signed)
Has had chronic difficulty with headaches, usually takes tylenol and ibuprofen however it is not working, will give injection of ketorolac 60 mg in office today (has tolerated this medication in the past). Will start her on topiramate and gradually titrate up to 50 mg BID, also will give her sumatriptan for future headaches. Sending to neurology for the tremor and they can further evaluate as needed. She has a myriad of pain and "nerve" complaints today, states her fibromyalgia is uncontrolled as well, also reporting numbness and tingling in her BL arms, etc. She will likely need EMG of the upper extremities to rule out cervical radiculopathy. Will see her abck in 6 months for annual physical.

## 2024-01-24 NOTE — Progress Notes (Signed)
Established Patient Office Visit  Subjective   Patient ID: Samantha Fleming, female    DOB: [birth year]  Age: 43 y.o. MRN: [id number]  Chief Complaint  Patient presents with   Medical Management of Chronic Issues   Headache    X2 days    Pt is here for follow up on her chronic medical issues, she reports that she is seeing a Dr. Katrinka Blazing for her behavioral health care, he is reducing her sertraline and putting her on trintellix. She reports Dr. Drue Second recently increased her gabapentin to 600 mg TID.   Pt is reporting a new headache, has lasted days, none of her previous treatments have worked on this particular headache. Pt has a history of chronic headaches in the past, they have never lasted this long before. She has sensitivity to light, nausea, and thinks her right eye is "droopy"". She had a CT head about 5 months ago which showed no abnormalities. Pt also states that her hands are "shaking" in the afternoon and evening.     Current Outpatient Medications  Medication Instructions   albuterol (VENTOLIN HFA) 108 (90 Base) MCG/ACT inhaler 2 puffs, Inhalation, Every 6 hours PRN   amitriptyline (ELAVIL) 25 mg, Oral, Daily at bedtime   benzonatate (TESSALON) 100 mg, Oral, 3 times daily PRN   bictegravir-emtricitabine-tenofovir AF (BIKTARVY) 50-200-25 MG TABS tablet 1 tablet, Oral, Daily   buprenorphine (SUBUTEX) 10 mg, Daily   clonazePAM (KLONOPIN) 1 MG tablet Take 1 tablet (1 mg) by mouth 2 times daily as needed for anxiety.   Ensure (ENSURE) 237 mLs, Oral, 2 times daily between meals, Dispense quantity for 3 month   gabapentin (NEURONTIN) 600 mg, Oral, 3 times daily   ibuprofen (ADVIL) 600 mg, Oral, Every 6 hours PRN   nicotine (NICODERM CQ - DOSED IN MG/24 HOURS) 21 mg/24hr patch APPLY 1 PATCH(21 MG) TOPICALLY TO THE SKIN DAILY   sertraline (ZOLOFT) 100 mg, Oral, Daily   sertraline (ZOLOFT) 50 mg, Oral, Daily   SUMAtriptan (IMITREX) 50 mg, Oral, Daily PRN, May repeat in 2 hours if  headache persists or recurs.   topiramate (TOPAMAX) 50 MG tablet Take 0.5 tablets (25 mg total) by mouth at bedtime for 7 days, THEN 0.5 tablets (25 mg total) 2 (two) times daily for 7 days, THEN 1 tablet (50 mg total) 2 (two) times daily.   Vitamin D, Ergocalciferol, 50000 units CAPS 1 capsule, Oral, Weekly   vortioxetine HBr (TRINTELLIX) 10 mg, Daily    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   Hepatitis C antibody test positive 04/29/2015   HIV disease (HCC) 04/29/2015   PTSD (post-traumatic stress disorder) 04/29/2015   Menorrhagia with irregular cycle 04/29/2015   GAD (generalized anxiety disorder) 04/29/2015   History of intravenous drug use in remission 04/29/2015   Smoker 04/29/2015   Fibromyalgia 04/29/2015      Review of Systems  All other systems reviewed and are negative.     Objective:     BP 118/78   Pulse 93   Temp 97.8 F (36.6 C) (Oral)   Ht 5'  5" (1.651 m)   Wt 188 lb 8 oz (85.5 kg)   LMP 01/20/2024 (Approximate)   SpO2 96%   BMI 31.37 kg/m    Physical Exam Vitals reviewed.  Constitutional:      Appearance: Normal appearance. She is well-groomed and normal weight.  Eyes:     Conjunctiva/sclera: Conjunctivae normal.  Neck:     Thyroid: No thyromegaly.  Cardiovascular:     Rate and Rhythm: Regular rhythm.     Pulses: Normal pulses.     Heart sounds: S1 normal and S2 normal.  Pulmonary:     Effort: Pulmonary effort is normal.     Breath sounds: Normal breath sounds and air entry.  Musculoskeletal:      Right lower leg: No edema.     Left lower leg: No edema.  Neurological:     Mental Status: She is alert and oriented to person, place, and time. Mental status is at baseline.     Gait: Gait is intact.  Psychiatric:        Mood and Affect: Mood and affect normal.        Speech: Speech normal.        Behavior: Behavior normal.        Judgment: Judgment normal.      No results found for any visits on 01/24/24.    The 10-year ASCVD risk score (Arnett DK, et al., 2019) is: 2.6%    Assessment & Plan:  Tremor -     Ambulatory referral to Neurology  Chronic migraine with aura without status migrainosus, not intractable Assessment & Plan: Has had chronic difficulty with headaches, usually takes tylenol and ibuprofen however it is not working, will give injection of ketorolac 60 mg in office today (has tolerated this medication in the past). Will start her on topiramate and gradually titrate up to 50 mg BID, also will give her sumatriptan for future headaches. Sending to neurology for the tremor and they can further evaluate as needed. She has a myriad of pain and "nerve" complaints today, states her fibromyalgia is uncontrolled as well, also reporting numbness and tingling in her BL arms, etc. She will likely need EMG of the upper extremities to rule out cervical radiculopathy. Will see her abck in 6 months for annual physical.   Orders: -     SUMAtriptan Succinate; Take 1 tablet (50 mg total) by mouth daily as needed. May repeat in 2 hours if headache persists or recurs.  Dispense: 10 tablet; Refill: 5 -     Ambulatory referral to Neurology -     Topiramate; Take 0.5 tablets (25 mg total) by mouth at bedtime for 7 days, THEN 0.5 tablets (25 mg total) 2 (two) times daily for 7 days, THEN 1 tablet (50 mg total) 2 (two) times daily.  Dispense: 60 tablet; Refill: 5 -     Ketorolac Tromethamine  Fibromyalgia -     Ambulatory referral to Neurology     Return in about 6 months (around  07/23/2024) for annual physical exam.    Karie Georges, MD

## 2024-01-25 LAB — COMPLETE METABOLIC PANEL WITH GFR
AG Ratio: 2 (calc) (ref 1.0–2.5)
ALT: 8 U/L (ref 6–29)
AST: 13 U/L (ref 10–30)
Albumin: 4.7 g/dL (ref 3.6–5.1)
Alkaline phosphatase (APISO): 47 U/L (ref 31–125)
BUN: 13 mg/dL (ref 7–25)
CO2: 28 mmol/L (ref 20–32)
Calcium: 9.7 mg/dL (ref 8.6–10.2)
Chloride: 105 mmol/L (ref 98–110)
Creat: 0.85 mg/dL (ref 0.50–0.99)
Globulin: 2.4 g/dL (ref 1.9–3.7)
Glucose, Bld: 48 mg/dL — ABNORMAL LOW (ref 65–99)
Potassium: 4.4 mmol/L (ref 3.5–5.3)
Sodium: 140 mmol/L (ref 135–146)
Total Bilirubin: 0.3 mg/dL (ref 0.2–1.2)
Total Protein: 7.1 g/dL (ref 6.1–8.1)
eGFR: 88 mL/min/{1.73_m2} (ref >=60)

## 2024-01-25 LAB — CBC WITH DIFFERENTIAL/PLATELET
Absolute Lymphocytes: 1568 {cells}/uL (ref 850–3900)
Absolute Monocytes: 320 {cells}/uL (ref 200–950)
Basophils Absolute: 78 {cells}/uL (ref 0–200)
Basophils Relative: 2 %
Eosinophils Absolute: 70 {cells}/uL (ref 15–500)
Eosinophils Relative: 1.8 %
HCT: 41.2 % (ref 35.0–45.0)
Hemoglobin: 13.3 g/dL (ref 11.7–15.5)
MCH: 29.7 pg (ref 27.0–33.0)
MCHC: 32.3 g/dL (ref 32.0–36.0)
MCV: 92 fL (ref 80.0–100.0)
MPV: 11.3 fL (ref 7.5–12.5)
Monocytes Relative: 8.2 %
Neutro Abs: 1864 {cells}/uL (ref 1500–7800)
Neutrophils Relative %: 47.8 %
Platelets: 211 10*3/uL (ref 140–400)
RBC: 4.48 10*6/uL (ref 3.80–5.10)
RDW: 13.1 % (ref 11.0–15.0)
Total Lymphocyte: 40.2 %
WBC: 3.9 10*3/uL (ref 3.8–10.8)

## 2024-01-25 LAB — HIV-1 RNA QUANT-NO REFLEX-BLD
HIV 1 RNA Quant: NOT DETECTED {copies}/mL
HIV-1 RNA Quant, Log: NOT DETECTED {Log}

## 2024-01-25 LAB — RPR: RPR Ser Ql: NONREACTIVE

## 2024-02-07 DIAGNOSIS — Z79899 Other long term (current) drug therapy: Secondary | ICD-10-CM | POA: Diagnosis not present

## 2024-02-12 DIAGNOSIS — Z79899 Other long term (current) drug therapy: Secondary | ICD-10-CM | POA: Diagnosis not present

## 2024-02-26 ENCOUNTER — Other Ambulatory Visit: Payer: Self-pay | Admitting: Internal Medicine

## 2024-03-05 DIAGNOSIS — Z79899 Other long term (current) drug therapy: Secondary | ICD-10-CM | POA: Diagnosis not present

## 2024-03-11 DIAGNOSIS — Z79899 Other long term (current) drug therapy: Secondary | ICD-10-CM | POA: Diagnosis not present

## 2024-04-04 DIAGNOSIS — Z79899 Other long term (current) drug therapy: Secondary | ICD-10-CM | POA: Diagnosis not present

## 2024-04-08 DIAGNOSIS — Z79899 Other long term (current) drug therapy: Secondary | ICD-10-CM | POA: Diagnosis not present

## 2024-04-17 ENCOUNTER — Ambulatory Visit

## 2024-05-01 ENCOUNTER — Ambulatory Visit: Payer: Medicaid Other | Admitting: Internal Medicine

## 2024-05-03 DIAGNOSIS — Z79899 Other long term (current) drug therapy: Secondary | ICD-10-CM | POA: Diagnosis not present

## 2024-05-07 DIAGNOSIS — Z79899 Other long term (current) drug therapy: Secondary | ICD-10-CM | POA: Diagnosis not present

## 2024-05-08 NOTE — Progress Notes (Signed)
 The 10-year ASCVD risk score (Arnett DK, et al., 2019) is: 2.6%   Values used to calculate the score:     Age: 43 years     Sex: Female     Is Non-Hispanic African American: No     Diabetic: No     Tobacco smoker: Yes     Systolic Blood Pressure: 118 mmHg     Is BP treated: No     HDL Cholesterol: 50 mg/dL     Total Cholesterol: 206 mg/dL  Arlon Bergamo, BSN, RN

## 2024-05-12 DIAGNOSIS — Z79899 Other long term (current) drug therapy: Secondary | ICD-10-CM | POA: Diagnosis not present

## 2024-05-13 ENCOUNTER — Ambulatory Visit: Payer: Medicaid Other | Admitting: Neurology

## 2024-05-16 ENCOUNTER — Other Ambulatory Visit: Payer: Self-pay | Admitting: Internal Medicine

## 2024-05-16 DIAGNOSIS — B20 Human immunodeficiency virus [HIV] disease: Secondary | ICD-10-CM

## 2024-06-03 DIAGNOSIS — Z79899 Other long term (current) drug therapy: Secondary | ICD-10-CM | POA: Diagnosis not present

## 2024-06-09 ENCOUNTER — Ambulatory Visit: Admitting: Internal Medicine

## 2024-06-09 DIAGNOSIS — Z79899 Other long term (current) drug therapy: Secondary | ICD-10-CM | POA: Diagnosis not present

## 2024-06-16 ENCOUNTER — Other Ambulatory Visit: Payer: Self-pay

## 2024-06-16 DIAGNOSIS — Z79899 Other long term (current) drug therapy: Secondary | ICD-10-CM

## 2024-06-16 DIAGNOSIS — Z113 Encounter for screening for infections with a predominantly sexual mode of transmission: Secondary | ICD-10-CM

## 2024-06-16 DIAGNOSIS — B20 Human immunodeficiency virus [HIV] disease: Secondary | ICD-10-CM

## 2024-06-17 ENCOUNTER — Other Ambulatory Visit

## 2024-06-20 ENCOUNTER — Other Ambulatory Visit: Payer: Self-pay | Admitting: Internal Medicine

## 2024-06-20 DIAGNOSIS — B20 Human immunodeficiency virus [HIV] disease: Secondary | ICD-10-CM

## 2024-06-24 ENCOUNTER — Other Ambulatory Visit: Payer: Self-pay | Admitting: Internal Medicine

## 2024-06-24 DIAGNOSIS — M797 Fibromyalgia: Secondary | ICD-10-CM

## 2024-07-02 DIAGNOSIS — Z79899 Other long term (current) drug therapy: Secondary | ICD-10-CM | POA: Diagnosis not present

## 2024-07-02 DIAGNOSIS — Z131 Encounter for screening for diabetes mellitus: Secondary | ICD-10-CM | POA: Diagnosis not present

## 2024-07-02 DIAGNOSIS — Z13220 Encounter for screening for lipoid disorders: Secondary | ICD-10-CM | POA: Diagnosis not present

## 2024-07-02 DIAGNOSIS — E559 Vitamin D deficiency, unspecified: Secondary | ICD-10-CM | POA: Diagnosis not present

## 2024-07-04 DIAGNOSIS — Z79899 Other long term (current) drug therapy: Secondary | ICD-10-CM | POA: Diagnosis not present

## 2024-07-08 DIAGNOSIS — Z79899 Other long term (current) drug therapy: Secondary | ICD-10-CM | POA: Diagnosis not present

## 2024-07-09 ENCOUNTER — Other Ambulatory Visit

## 2024-07-09 ENCOUNTER — Ambulatory Visit (INDEPENDENT_AMBULATORY_CARE_PROVIDER_SITE_OTHER): Admitting: Pharmacist

## 2024-07-09 ENCOUNTER — Other Ambulatory Visit: Payer: Self-pay

## 2024-07-09 VITALS — Wt 165.3 lb

## 2024-07-09 DIAGNOSIS — Z79899 Other long term (current) drug therapy: Secondary | ICD-10-CM | POA: Diagnosis not present

## 2024-07-09 DIAGNOSIS — B20 Human immunodeficiency virus [HIV] disease: Secondary | ICD-10-CM

## 2024-07-09 DIAGNOSIS — Z113 Encounter for screening for infections with a predominantly sexual mode of transmission: Secondary | ICD-10-CM | POA: Diagnosis not present

## 2024-07-09 MED ORDER — BIKTARVY 50-200-25 MG PO TABS: 1.0000 | ORAL_TABLET | Freq: Every day | ORAL | 5 refills | Status: DC

## 2024-07-09 NOTE — Progress Notes (Signed)
 HPI: Samantha Fleming is a 43 y.o. female who presents to the [facility] pharmacy clinic for HIV follow-up.  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   Hepatitis C antibody test positive 04/29/2015   HIV disease (HCC) 04/29/2015   PTSD (post-traumatic stress disorder) 04/29/2015   Menorrhagia with irregular cycle 04/29/2015   GAD (generalized anxiety disorder) 04/29/2015   History of intravenous drug use in remission 04/29/2015   Smoker 04/29/2015   Fibromyalgia 04/29/2015    Patient's Medications  New Prescriptions   No medications on file  Previous Medications   ALBUTEROL  (VENTOLIN  HFA) 108 (90 BASE) MCG/ACT INHALER    Inhale 2 puffs into the lungs every 6 (six) hours as needed for wheezing or shortness of breath.   AMITRIPTYLINE  (ELAVIL ) 25 MG TABLET    Take 1 tablet (25 mg total) by mouth at bedtime.   BENZONATATE  (TESSALON ) 100 MG CAPSULE    Take 1 capsule (100 mg total) by mouth 3 (three) times daily as needed for cough.   BIKTARVY  50-200-25 MG TABS TABLET    TAKE 1 TABLET BY MOUTH DAILY   BUPRENORPHINE (SUBUTEX) 2 MG SUBL SL TABLET    Place 10 mg under the tongue daily.   CLONAZEPAM  (KLONOPIN ) 1 MG TABLET    Take 1 tablet (1 mg) by mouth 2 times daily as needed for anxiety.   ENSURE (ENSURE)    Take 237 mLs by mouth 2 (two) times daily between meals. Dispense quantity for 3 month   GABAPENTIN  (NEURONTIN ) 600 MG TABLET    TAKE 1 TABLET(600 MG) BY MOUTH THREE TIMES DAILY   IBUPROFEN  (ADVIL ) 600 MG TABLET    Take 1 tablet (600 mg total) by mouth  every 6 (six) hours as needed.   NICOTINE  (NICODERM CQ  - DOSED IN MG/24 HOURS) 21 MG/24HR PATCH    APPLY 1 PATCH(21 MG) TOPICALLY TO THE SKIN DAILY   SERTRALINE  (ZOLOFT ) 100 MG TABLET    Take 1 tablet (100 mg total) by mouth daily.   SERTRALINE  (ZOLOFT ) 50 MG TABLET    Take 1 tablet (50 mg total) by mouth daily.   SUMATRIPTAN  (IMITREX ) 50 MG TABLET    Take 1 tablet (50 mg total) by mouth daily as needed. May repeat in 2 hours if headache persists or recurs.   TOPIRAMATE  (TOPAMAX ) 50 MG TABLET    Take 0.5 tablets (25 mg total) by mouth at bedtime for 7 days, THEN 0.5 tablets (25 mg total) 2 (two) times daily for 7 days, THEN 1 tablet (50 mg total) 2 (two) times daily.   VITAMIN D , ERGOCALCIFEROL , 50000 UNITS CAPS    Take 1 capsule by mouth once a week.   VORTIOXETINE HBR (TRINTELLIX) 10 MG TABS TABLET    Take 10 mg by mouth daily.  Modified Medications   No medications on file  Discontinued Medications   No medications on file    Allergies: Allergies  Allergen Reactions   Asa [Aspirin] Swelling   Suboxone [Buprenorphine-Naloxone] Diarrhea   Rilpivirine Diarrhea    Patient had severe gastrointestinal complaints including diarrhea while on  COMPLERA. She DID NOT HAVE ANAPHYLAXIS AND SHE HAS TOLERATED THE OTHER 2 COMPONENTS OF COMPLERA TNF AND EMTRICTABINE WITHOUT ANY PROBLEMS    Past Medical History: Past Medical History:  Diagnosis Date   Anxiety    Depression    Fibromyalgia 04/29/2015   GAD (generalized anxiety disorder) 04/29/2015   History of hepatitis C 04/29/2015   History of intravenous drug use in remission 04/29/2015   History of intravenous drug use in remission    HIV disease (HCC) 04/29/2015   Insomnia 09/15/2015   Irregular menses 04/29/2015   Left leg pain 09/15/2015   Neuromuscular disorder (HCC)    Pain of left calf 10/06/2015   PTSD (post-traumatic stress disorder) 04/29/2015   Reduced libido 09/15/2015   Smoker 04/29/2015   Tremor 10/06/2015   Vaginitis and vulvovaginitis  04/29/2015   Yeast infection 09/15/2015    Social History: Social History   Socioeconomic History   Marital status: Widowed    Spouse name: Not on file   Number of children: Not on file   Years of education: Not on file   Highest education level: Not on file  Occupational History   Not on file  Tobacco Use   Smoking status: Every Day    Current packs/day: 1.00    Average packs/day: 1 pack/day for 16.0 years (16.0 ttl pk-yrs)    Types: Cigarettes   Smokeless tobacco: Never  Vaping Use   Vaping status: Former   Substances: CBD  Substance and Sexual Activity   Alcohol use: Yes    Alcohol/week: 1.0 standard drink of alcohol    Types: 1 Glasses of wine per week    Comment: occasional   Drug use: Yes    Types: Marijuana    Comment: history of polysubstance drug use    Sexual activity: Yes    Partners: Male    Birth control/protection: None    Comment: declined condoms  Other Topics Concern   Not on file  Social History Narrative   Not on file   Social Drivers of Health   Financial Resource Strain: Not on file  Food Insecurity: Not on file  Transportation Needs: Not on file  Physical Activity: Not on file  Stress: Not on file  Social Connections: Not on file    Labs: Lab Results  Component Value Date   HIV1RNAQUANT Not Detected 01/23/2024   HIV1RNAQUANT Not Detected 02/13/2023   HIV1RNAQUANT Not Detected 10/17/2022   CD4TABS 604 01/23/2024   CD4TABS 781 02/13/2023   CD4TABS 793 10/17/2022    RPR and STI Lab Results  Component Value Date   LABRPR NON-REACTIVE 01/23/2024   LABRPR NON-REACTIVE 10/17/2022   LABRPR NON-REACTIVE 05/25/2022   LABRPR NON-REACTIVE 01/03/2022   LABRPR NON-REACTIVE 05/24/2021    STI Results GC CT  10/17/2022  2:37 PM Negative  Negative   05/20/2019 12:00 AM **POSITIVE**  Negative   01/15/2019 12:00 AM Negative  Negative   11/21/2016 12:00 AM Negative  Negative   09/16/2016 12:00 AM Negative  Negative   06/29/2016 12:00 AM  Negative  Negative   09/15/2015 12:00 AM Negative  Negative   04/14/2015 12:00 AM Negative  Negative     Hepatitis B Lab Results  Component Value Date   HEPBSAB POS (A) 04/14/2015   HEPBSAG NEGATIVE 04/14/2015   HEPBCAB NON REACTIVE 04/14/2015   Hepatitis C Lab Results  Component Value Date   HCVRNAPCRQN <15 05/24/2021   Hepatitis A Lab Results  Component Value Date   HAV BORDERLINE (A)  04/14/2015   Lipids: Lab Results  Component Value Date   CHOL 206 (H) 10/17/2022   TRIG 190 (H) 10/17/2022   HDL 50 10/17/2022   CHOLHDL 4.1 10/17/2022   VLDL 10 05/31/2017   LDLCALC 125 (H) 10/17/2022    Current HIV Regimen: Biktarvy   Assessment: Samantha presents to clinic today for HIV follow-up. States she has been taking Biktarvy  daily without any concerns. Consistent fill history with [facility]; her Medicaid coverage remains active. Will check routine HIV RNA and lipid panel today. Other labs per triage and primary care team. Following up with her PCP on 08/07/24 who can help manage medication for migraines and fibromyalgia. Referred to neurology for these as well. Follow up with Dr. Luiz in 6 months.   Eligible for PCV20 and Shingles vaccinations. She adamantly refuses vaccines stating they are against her religion.    Plan: - Continue Biktarvy  - Check HIV RNA and lipid panel (other labs per triage and primary care team) - Follow up with Dr. Luiz on 01/01/25   Alan Geralds, PharmD, CPP, BCIDP, AAHIVP Clinical Pharmacist Practitioner Infectious Diseases Clinical Pharmacist [facility] 07/09/2024, 2:24 PM

## 2024-07-10 ENCOUNTER — Telehealth: Admitting: Internal Medicine

## 2024-07-10 LAB — T-HELPER CELL (CD4) - (RCID CLINIC ONLY)
CD4 % Helper T Cell: 43 % (ref 33–65)
CD4 T Cell Abs: 746 /uL (ref 400–1790)

## 2024-07-10 LAB — HEMOGLOBIN A1C
Hgb A1c MFr Bld: 5 % (ref <5.7)
Mean Plasma Glucose: 97 mg/dL
eAG (mmol/L): 5.4 mmol/L

## 2024-07-10 LAB — VITAMIN D 25 HYDROXY (VIT D DEFICIENCY, FRACTURES): Vit D, 25-Hydroxy: 28 ng/mL — ABNORMAL LOW (ref 30–100)

## 2024-07-11 LAB — COMPLETE METABOLIC PANEL WITHOUT GFR
AG Ratio: 1.8 (calc) (ref 1.0–2.5)
ALT: 7 U/L (ref 6–29)
AST: 11 U/L (ref 10–30)
Albumin: 4.5 g/dL (ref 3.6–5.1)
Alkaline phosphatase (APISO): 40 U/L (ref 31–125)
BUN: 15 mg/dL (ref 7–25)
CO2: 21 mmol/L (ref 20–32)
Calcium: 9 mg/dL (ref 8.6–10.2)
Chloride: 108 mmol/L (ref 98–110)
Creat: 0.88 mg/dL (ref 0.50–0.99)
Globulin: 2.5 g/dL (ref 1.9–3.7)
Glucose, Bld: 72 mg/dL (ref 65–99)
Potassium: 3.9 mmol/L (ref 3.5–5.3)
Sodium: 136 mmol/L (ref 135–146)
Total Bilirubin: 0.3 mg/dL (ref 0.2–1.2)
Total Protein: 7 g/dL (ref 6.1–8.1)

## 2024-07-11 LAB — CBC WITH DIFFERENTIAL/PLATELET
Absolute Lymphocytes: 1730 {cells}/uL (ref 850–3900)
Absolute Monocytes: 290 {cells}/uL (ref 200–950)
Basophils Absolute: 80 {cells}/uL (ref 0–200)
Basophils Relative: 1.9 %
Eosinophils Absolute: 50 {cells}/uL (ref 15–500)
Eosinophils Relative: 1.2 %
HCT: 38.3 % (ref 35.0–45.0)
Hemoglobin: 12.5 g/dL (ref 11.7–15.5)
MCH: 31.5 pg (ref 27.0–33.0)
MCHC: 32.6 g/dL (ref 32.0–36.0)
MCV: 96.5 fL (ref 80.0–100.0)
MPV: 11.1 fL (ref 7.5–12.5)
Monocytes Relative: 6.9 %
Neutro Abs: 2050 {cells}/uL (ref 1500–7800)
Neutrophils Relative %: 48.8 %
Platelets: 213 Thousand/uL (ref 140–400)
RBC: 3.97 Million/uL (ref 3.80–5.10)
RDW: 11.9 % (ref 11.0–15.0)
Total Lymphocyte: 41.2 %
WBC: 4.2 Thousand/uL (ref 3.8–10.8)

## 2024-07-11 LAB — LIPID PANEL
Cholesterol: 198 mg/dL (ref <200)
HDL: 56 mg/dL (ref >=50)
LDL Cholesterol (Calc): 125 mg/dL — ABNORMAL HIGH
Non-HDL Cholesterol (Calc): 142 mg/dL — ABNORMAL HIGH (ref <130)
Total CHOL/HDL Ratio: 3.5 (calc) (ref <5.0)
Triglycerides: 73 mg/dL (ref <150)

## 2024-07-11 LAB — HIV-1 RNA QUANT-NO REFLEX-BLD
HIV 1 RNA Quant: NOT DETECTED {copies}/mL
HIV-1 RNA Quant, Log: NOT DETECTED {Log_copies}/mL

## 2024-07-11 LAB — RPR: RPR Ser Ql: NONREACTIVE

## 2024-07-23 DIAGNOSIS — Z79899 Other long term (current) drug therapy: Secondary | ICD-10-CM | POA: Diagnosis not present

## 2024-07-24 ENCOUNTER — Encounter: Payer: Medicaid Other | Admitting: Family Medicine

## 2024-08-04 DIAGNOSIS — Z79899 Other long term (current) drug therapy: Secondary | ICD-10-CM | POA: Diagnosis not present

## 2024-08-07 ENCOUNTER — Encounter: Admitting: Family Medicine

## 2024-08-12 DIAGNOSIS — Z79899 Other long term (current) drug therapy: Secondary | ICD-10-CM | POA: Diagnosis not present

## 2024-08-30 ENCOUNTER — Other Ambulatory Visit: Payer: Self-pay | Admitting: Internal Medicine

## 2024-08-30 DIAGNOSIS — M797 Fibromyalgia: Secondary | ICD-10-CM

## 2024-09-02 DIAGNOSIS — Z79899 Other long term (current) drug therapy: Secondary | ICD-10-CM | POA: Diagnosis not present

## 2024-09-06 ENCOUNTER — Other Ambulatory Visit: Payer: Self-pay | Admitting: Internal Medicine

## 2024-09-06 DIAGNOSIS — M797 Fibromyalgia: Secondary | ICD-10-CM

## 2024-09-08 DIAGNOSIS — Z79899 Other long term (current) drug therapy: Secondary | ICD-10-CM | POA: Diagnosis not present

## 2024-09-08 NOTE — Telephone Encounter (Signed)
 Refill was sent 9/8

## 2024-09-09 ENCOUNTER — Telehealth: Payer: Self-pay | Admitting: Neurology

## 2024-09-09 NOTE — Telephone Encounter (Signed)
 Myc conf

## 2024-09-11 ENCOUNTER — Encounter: Payer: Self-pay | Admitting: Neurology

## 2024-09-11 ENCOUNTER — Ambulatory Visit: Admitting: Neurology

## 2024-09-29 NOTE — Progress Notes (Deleted)
 Chief Complaint: No chief complaint on file.   History of Present Illness:  Samantha Fleming is a 43 y.o. female who is seen in consultation from Ozell Heron HERO, MD for evaluation of ***.   Past Medical History:  Past Medical History:  Diagnosis Date   Anxiety    Depression    Fibromyalgia 04/29/2015   GAD (generalized anxiety disorder) 04/29/2015   History of hepatitis C 04/29/2015   History of intravenous drug use in remission 04/29/2015   History of intravenous drug use in remission    HIV disease (HCC) 04/29/2015   Insomnia 09/15/2015   Irregular menses 04/29/2015   Left leg pain 09/15/2015   Neuromuscular disorder (HCC)    Pain of left calf 10/06/2015   PTSD (post-traumatic stress disorder) 04/29/2015   Reduced libido 09/15/2015   Smoker 04/29/2015   Tremor 10/06/2015   Vaginitis and vulvovaginitis 04/29/2015   Yeast infection 09/15/2015    Past Surgical History:  Past Surgical History:  Procedure Laterality Date   DILATION AND EVACUATION N/A 09/16/2016   Procedure: DILATATION AND EVACUATION;  Surgeon: Ozell LITTIE Cowman, MD;  Location: [facility];  Service: Gynecology;  Laterality: N/A;    Allergies:  Allergies  Allergen Reactions   Asa [Aspirin] Swelling   Suboxone [Buprenorphine-Naloxone] Diarrhea   Rilpivirine Diarrhea    Patient had severe gastrointestinal complaints including diarrhea while on COMPLERA. She DID NOT HAVE ANAPHYLAXIS AND SHE HAS TOLERATED THE OTHER 2 COMPONENTS OF COMPLERA TNF AND EMTRICTABINE WITHOUT ANY PROBLEMS    Family History:  Family History  Problem Relation Age of Onset   Depression Mother    Arthritis Mother    Alcohol abuse Mother    Fibromyalgia Mother    Mental illness Mother    Mental illness Father    Drug abuse Father    Depression Father    Alcohol abuse Father    Hyperlipidemia Maternal Grandmother    Depression Maternal Grandmother    Arthritis Maternal Grandmother    Mental illness Maternal Grandmother    Heart disease Maternal  Grandfather    Hearing loss Maternal Grandfather    Early death Maternal Grandfather    COPD Maternal Grandfather    Arthritis Maternal Grandfather    Alcohol abuse Maternal Grandfather    Sudden death Maternal Grandfather     Social History:  Social History   Tobacco Use   Smoking status: Every Day    Current packs/day: 1.00    Average packs/day: 1 pack/day for 16.0 years (16.0 ttl pk-yrs)    Types: Cigarettes   Smokeless tobacco: Never  Vaping Use   Vaping status: Former   Substances: CBD  Substance Use Topics   Alcohol use: Yes    Alcohol/week: 1.0 standard drink of alcohol    Types: 1 Glasses of wine per week    Comment: occasional   Drug use: Yes    Types: Marijuana    Comment: history of polysubstance drug use     Review of symptoms:  Constitutional:  Negative for unexplained weight loss, night sweats, fever, chills ENT:  Negative for nose bleeds, sinus pain, painful swallowing CV:  Negative for chest pain, shortness of breath, exercise intolerance, palpitations, loss of consciousness Resp:  Negative for cough, wheezing, shortness of breath GI:  Negative for nausea, vomiting, diarrhea, bloody stools GU:  Positives noted in HPI; otherwise negative for gross hematuria, dysuria, urinary incontinence Neuro:  Negative for seizures, poor balance, limb weakness, slurred speech Psych:  Negative for lack of  energy, depression, anxiety Endocrine:  Negative for polydipsia, polyuria, symptoms of hypoglycemia (dizziness, hunger, sweating) Hematologic:  Negative for anemia, purpura, petechia, prolonged or excessive bleeding, use of anticoagulants  Allergic:  Negative for difficulty breathing or choking as a result of exposure to anything; no shellfish allergy; no allergic response (rash/itch) to materials, foods  Physical exam: There were no vitals taken for this visit. GENERAL APPEARANCE:  Well appearing, well developed, well nourished, NAD HEENT: Atraumatic, Normocephalic,  oropharynx clear. NECK: Supple without lymphadenopathy or thyromegaly. LUNGS: Clear to auscultation bilaterally. HEART: Regular Rate and Rhythm without murmurs, gallops, or rubs. ABDOMEN: Soft, non-tender, No Masses. EXTREMITIES: Moves all extremities well.  Without clubbing, cyanosis, or edema. NEUROLOGIC:  Alert and oriented x 3, normal gait, CN II-XII grossly intact.  MENTAL STATUS:  Appropriate. BACK:  Non-tender to palpation.  No CVAT SKIN:  Warm, dry and intact.    Results: No results found for this or any previous visit (from the past 24 hours).  I have reviewed prior patient's records  I have reviewed referring/prior physicians records  I have reviewed urinalysis  I have reviewed prior urine cultures  I reviewed prior imaging studies  Assessment: No diagnosis found.   Plan: ***

## 2024-09-30 ENCOUNTER — Ambulatory Visit: Admitting: Urology

## 2024-09-30 DIAGNOSIS — R300 Dysuria: Secondary | ICD-10-CM

## 2024-09-30 DIAGNOSIS — Z79899 Other long term (current) drug therapy: Secondary | ICD-10-CM | POA: Diagnosis not present

## 2024-10-01 ENCOUNTER — Other Ambulatory Visit: Payer: Self-pay

## 2024-10-01 MED ORDER — ENSURE PO LIQD: 237.0000 mL | Freq: Two times a day (BID) | ORAL | 11 refills | Status: AC

## 2024-10-02 DIAGNOSIS — Z79899 Other long term (current) drug therapy: Secondary | ICD-10-CM | POA: Diagnosis not present

## 2024-10-03 ENCOUNTER — Other Ambulatory Visit: Payer: Self-pay | Admitting: *Deleted

## 2024-10-03 DIAGNOSIS — Z006 Encounter for examination for normal comparison and control in clinical research program: Secondary | ICD-10-CM

## 2024-10-06 DIAGNOSIS — Z79899 Other long term (current) drug therapy: Secondary | ICD-10-CM | POA: Diagnosis not present

## 2024-10-07 DIAGNOSIS — Z79899 Other long term (current) drug therapy: Secondary | ICD-10-CM | POA: Diagnosis not present

## 2024-10-10 ENCOUNTER — Other Ambulatory Visit: Payer: Self-pay | Admitting: Internal Medicine

## 2024-10-10 DIAGNOSIS — M797 Fibromyalgia: Secondary | ICD-10-CM

## 2024-10-14 NOTE — Telephone Encounter (Signed)
 Okay to refill per Dr. Levern Reader.   Jillian Pianka, BSN, RN

## 2024-10-14 NOTE — Telephone Encounter (Signed)
 Secure chat sent to provider.

## 2024-10-14 NOTE — Telephone Encounter (Signed)
Ok to refill medication 

## 2024-10-17 ENCOUNTER — Other Ambulatory Visit: Payer: Self-pay | Admitting: Internal Medicine

## 2024-10-17 DIAGNOSIS — M797 Fibromyalgia: Secondary | ICD-10-CM

## 2024-10-31 DIAGNOSIS — Z79899 Other long term (current) drug therapy: Secondary | ICD-10-CM | POA: Diagnosis not present

## 2024-11-06 DIAGNOSIS — Z79899 Other long term (current) drug therapy: Secondary | ICD-10-CM | POA: Diagnosis not present

## 2024-11-25 ENCOUNTER — Other Ambulatory Visit: Payer: Self-pay | Admitting: Internal Medicine

## 2024-11-25 DIAGNOSIS — M797 Fibromyalgia: Secondary | ICD-10-CM

## 2024-11-25 NOTE — Telephone Encounter (Signed)
 Received call from patient requesting gabapentin  refill. Notified her that refill request was routed to her PCP to see if they could fill it. Notified her message would be sent to Dr. Luiz for approval, but encouraged her to see if PCP could fill sooner.   Samantha Fleming, BSN, RN

## 2024-11-25 NOTE — Telephone Encounter (Signed)
 Patient has PCP.   Ozell Heron HERO, MD     PCP - General, Family Medicine    Since 06/27/2023    [phone]   Duwaine Lowe, BSN, RN

## 2024-11-26 MED ORDER — GABAPENTIN 600 MG PO TABS: ORAL_TABLET | ORAL | 2 refills | Status: DC

## 2024-11-26 NOTE — Telephone Encounter (Signed)
 Okay to refill per Dr. Levern Reader.   Jillian Pianka, BSN, RN

## 2024-11-26 NOTE — Addendum Note (Signed)
 Addended by: FLORENE BOUCHARD D on: 11/26/2024 08:59 AM   Modules accepted: Orders

## 2025-01-01 ENCOUNTER — Ambulatory Visit: Admitting: Internal Medicine

## 2025-01-15 ENCOUNTER — Ambulatory Visit: Admitting: Internal Medicine

## 2025-01-15 ENCOUNTER — Encounter: Payer: Self-pay | Admitting: Internal Medicine

## 2025-01-15 ENCOUNTER — Other Ambulatory Visit: Payer: Self-pay

## 2025-01-15 VITALS — BP 119/71 | HR 109 | Temp 98.1°F | Ht 65.0 in | Wt 164.0 lb

## 2025-01-15 DIAGNOSIS — J4520 Mild intermittent asthma, uncomplicated: Secondary | ICD-10-CM | POA: Diagnosis not present

## 2025-01-15 DIAGNOSIS — G629 Polyneuropathy, unspecified: Secondary | ICD-10-CM | POA: Diagnosis not present

## 2025-01-15 DIAGNOSIS — B20 Human immunodeficiency virus [HIV] disease: Secondary | ICD-10-CM | POA: Diagnosis present

## 2025-01-15 DIAGNOSIS — Z79899 Other long term (current) drug therapy: Secondary | ICD-10-CM | POA: Diagnosis not present

## 2025-01-15 MED ORDER — ALBUTEROL SULFATE HFA 108 (90 BASE) MCG/ACT IN AERS: 2.0000 | INHALATION_SPRAY | Freq: Four times a day (QID) | RESPIRATORY_TRACT | 2 refills | Status: AC | PRN

## 2025-01-15 MED ORDER — GABAPENTIN 800 MG PO TABS: 800.0000 mg | ORAL_TABLET | Freq: Three times a day (TID) | ORAL | 3 refills | Status: AC

## 2025-01-15 MED ORDER — BIKTARVY 50-200-25 MG PO TABS: 1.0000 | ORAL_TABLET | Freq: Every day | ORAL | 5 refills | Status: AC

## 2025-01-15 NOTE — Progress Notes (Signed)
 "     RFV: follow up for hiv disease Patient ID: Samantha Fleming, female   DOB: [birth year], 44 y.o.   MRN: [id number]  HPI 44yo F with HIV disease, CD 4 count of  746/VL<20(July 2026), currently on biktarvy . She reports taking daily without any issue. She started care with a New primary care doctor and also subutex for chronic opiate dependence. Still has worsening neuropathy. PCP recommended that she increased her doses for trial basis.  Outpatient Encounter Medications as of 01/15/2025  Medication Sig   albuterol  (VENTOLIN  HFA) 108 (90 Base) MCG/ACT inhaler Inhale 2 puffs into the lungs every 6 (six) hours as needed for wheezing or shortness of breath.   bictegravir-emtricitabine -tenofovir  AF (BIKTARVY ) 50-200-25 MG TABS tablet Take 1 tablet by mouth daily.   buprenorphine (SUBUTEX) 2 MG SUBL SL tablet Place 10 mg under the tongue daily.   busPIRone  (BUSPAR ) 10 MG tablet Take 10 mg by mouth 3 (three) times daily.   clonazePAM  (KLONOPIN ) 1 MG tablet Take 1 tablet (1 mg) by mouth 2 times daily as needed for anxiety.   Ensure (ENSURE) Take 237 mLs by mouth 2 (two) times daily between meals. Dispense quantity for 3 month   gabapentin  (NEURONTIN ) 600 MG tablet TAKE 1 TABLET(600 MG) BY MOUTH THREE TIMES DAILY   methylphenidate 18 MG PO CR tablet Take 18 mg by mouth every morning.   mirtazapine  (REMERON ) 7.5 MG tablet Take 7.5 mg by mouth at bedtime.   prazosin (MINIPRESS) 1 MG capsule Take 1 mg by mouth 2 (two) times daily.   QUEtiapine (SEROQUEL) 25 MG tablet Take 25 mg by mouth at bedtime.   SUMAtriptan  (IMITREX ) 50 MG tablet Take 1 tablet (50 mg total) by mouth daily as needed. May repeat in 2 hours if headache persists or recurs.   vortioxetine HBr (TRINTELLIX) 10 MG TABS tablet Take 10 mg by mouth daily.   sertraline  (ZOLOFT ) 100 MG tablet Take 1 tablet (100 mg total) by mouth daily. (Patient not taking: Reported on 01/15/2025)   sertraline  (ZOLOFT ) 50 MG tablet Take 1 tablet (50 mg total) by  mouth daily. (Patient not taking: Reported on 01/15/2025)   topiramate  (TOPAMAX ) 50 MG tablet Take 0.5 tablets (25 mg total) by mouth at bedtime for 7 days, THEN 0.5 tablets (25 mg total) 2 (two) times daily for 7 days, THEN 1 tablet (50 mg total) 2 (two) times daily. (Patient not taking: No sig reported)   No facility-administered encounter medications on file as of 01/15/2025.     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   Hepatitis C antibody test positive 04/29/2015   HIV disease (HCC) 04/29/2015   PTSD (post-traumatic stress disorder) 04/29/2015   Menorrhagia with irregular cycle 04/29/2015   GAD (generalized anxiety disorder) 04/29/2015   History of intravenous drug use in remission 04/29/2015   Smoker 04/29/2015   Fibromyalgia 04/29/2015     Health Maintenance Due  Topic Date Due   COVID-19 Vaccine (1) Never done   Pneumococcal Vaccine (1 of 2 - PCV) 09/13/2001   Hepatitis B Vaccines 19-59 Average Risk (  2 of 3 - 19+ 3-dose series) 05/27/2015   Influenza Vaccine  07/25/2024     Review of Systems 12 point ros is otherwise negative except what is mentioned above. Physical Exam   BP 119/71   Pulse (!) 109   Temp 98.1 F (36.7 C) (Temporal)   Ht 5' 5 (1.651 m)   Wt 164 lb (74.4 kg)   SpO2 97%   BMI 27.29 kg/m   Physical Exam  Constitutional:  oriented to person, place, and time. appears well-developed and well-nourished. No distress.  HENT: [place]/AT, PERRLA, no scleral icterus Mouth/Throat: Oropharynx is clear and moist. No  oropharyngeal exudate.  Cardiovascular: Normal rate, regular rhythm and normal heart sounds. Exam reveals no gallop and no friction rub.  No murmur heard.  Pulmonary/Chest: Effort normal and breath sounds normal. No respiratory distress.  has no wheezes.  Neck = supple, no nuchal rigidity Lymphadenopathy: no cervical adenopathy. No axillary adenopathy Neurological: alert and oriented to person, place, and time.  Skin: Skin is warm and dry. No rash noted. No erythema.  Psychiatric: a normal mood and affect.  behavior is normal.   Lab Results  Component Value Date   CD4TCELL 43 07/09/2024   Lab Results  Component Value Date   CD4TABS 746 07/09/2024   CD4TABS 604 01/23/2024   CD4TABS 781 02/13/2023   Lab Results  Component Value Date   HIV1RNAQUANT NOT DETECTED 07/09/2024   Lab Results  Component Value Date   HEPBSAB POS (A) 04/14/2015   Lab Results  Component Value Date   LABRPR NON-REACTIVE 07/09/2024    CBC Lab Results  Component Value Date   WBC 4.2 07/09/2024   RBC 3.97 07/09/2024   HGB 12.5 07/09/2024   HCT 38.3 07/09/2024   PLT 213 07/09/2024   MCV 96.5 07/09/2024   MCH 31.5 07/09/2024   MCHC 32.6 07/09/2024   RDW 11.9 07/09/2024   LYMPHSABS 2,210 02/13/2023   MONOABS 0.3 02/02/2023   EOSABS 50 07/09/2024    BMET Lab Results  Component Value Date   NA 136 07/09/2024   K 3.9 07/09/2024   CL 108 07/09/2024   CO2 21 07/09/2024   GLUCOSE 72 07/09/2024   BUN 15 07/09/2024   CREATININE 0.88 07/09/2024   CALCIUM 9.0 07/09/2024   GFRNONAA >60 02/02/2023   GFRAA 95 05/24/2021      Assessment and Plan HIV disease= continue with biktarvy . Will plan to check CD 4 count and HIV VL.   Long term medication management= will check cr  Peripheral neuropathy = Will increase gabapentin  to 800mg  tid, and gave instructions to how to scale up  Health maintenance = will check Rubeola Ig G   Asthma = will refill albuterol  inhaler  "

## 2025-01-15 NOTE — Patient Instructions (Signed)
 d

## 2025-01-22 ENCOUNTER — Other Ambulatory Visit: Payer: Self-pay

## 2025-01-28 ENCOUNTER — Other Ambulatory Visit: Payer: Self-pay

## 2025-01-28 DIAGNOSIS — B20 Human immunodeficiency virus [HIV] disease: Secondary | ICD-10-CM

## 2025-01-29 ENCOUNTER — Other Ambulatory Visit: Payer: Self-pay

## 2025-01-29 ENCOUNTER — Other Ambulatory Visit

## 2025-01-29 DIAGNOSIS — B20 Human immunodeficiency virus [HIV] disease: Secondary | ICD-10-CM

## 2025-01-30 LAB — COMPLETE METABOLIC PANEL WITHOUT GFR
AG Ratio: 1.9 (calc) (ref 1.0–2.5)
ALT: 7 U/L (ref 6–29)
AST: 11 U/L (ref 10–30)
Albumin: 4.4 g/dL (ref 3.6–5.1)
Alkaline phosphatase (APISO): 46 U/L (ref 31–125)
BUN: 12 mg/dL (ref 7–25)
CO2: 29 mmol/L (ref 20–32)
Calcium: 9.2 mg/dL (ref 8.6–10.2)
Chloride: 102 mmol/L (ref 98–110)
Creat: 0.9 mg/dL (ref 0.50–0.99)
Globulin: 2.3 g/dL (ref 1.9–3.7)
Glucose, Bld: 96 mg/dL (ref 65–99)
Potassium: 4.6 mmol/L (ref 3.5–5.3)
Sodium: 138 mmol/L (ref 135–146)
Total Bilirubin: 0.3 mg/dL (ref 0.2–1.2)
Total Protein: 6.7 g/dL (ref 6.1–8.1)

## 2025-01-30 LAB — SYPHILIS: RPR W/REFLEX TO RPR TITER AND TREPONEMAL ANTIBODIES, TRADITIONAL SCREENING AND DIAGNOSIS ALGORITHM: RPR Ser Ql: NONREACTIVE

## 2025-01-30 LAB — RUBEOLA ANTIBODY IGG: Rubeola IgG: 188 [AU]/ml

## 2025-04-16 ENCOUNTER — Telehealth: Payer: Self-pay | Admitting: Internal Medicine
